# Patient Record
Sex: Female | Born: 1985 | Race: Asian | Hispanic: No | Marital: Married | Smoking: Never smoker
Health system: Southern US, Community
[De-identification: ages and names within clinical notes are randomized; demographics above are authoritative.]

## PROBLEM LIST (undated history)

## (undated) DIAGNOSIS — L409 Psoriasis, unspecified: Secondary | ICD-10-CM

## (undated) DIAGNOSIS — Z970 Presence of artificial eye: Secondary | ICD-10-CM

## (undated) DIAGNOSIS — D649 Anemia, unspecified: Secondary | ICD-10-CM

## (undated) HISTORY — DX: Presence of artificial eye: Z97.0

## (undated) HISTORY — PX: TUBAL LIGATION: SHX77

---

## 2014-02-03 LAB — OB RESULTS CONSOLE GC/CHLAMYDIA
Chlamydia: NEGATIVE
Gonorrhea: NEGATIVE

## 2014-03-25 ENCOUNTER — Other Ambulatory Visit: Payer: Self-pay | Admitting: Obstetrics & Gynecology

## 2014-04-07 ENCOUNTER — Encounter (HOSPITAL_COMMUNITY): Payer: Self-pay

## 2014-04-08 ENCOUNTER — Encounter (HOSPITAL_COMMUNITY): Payer: Self-pay

## 2014-04-08 ENCOUNTER — Encounter (HOSPITAL_COMMUNITY): Payer: Self-pay | Admitting: Pharmacist

## 2014-04-08 ENCOUNTER — Encounter (HOSPITAL_COMMUNITY)
Admission: RE | Admit: 2014-04-08 | Discharge: 2014-04-08 | Disposition: A | Discharge status: Home / Self Care | Payer: 59 | Source: Ambulatory Visit | Attending: Obstetrics & Gynecology | Admitting: Obstetrics & Gynecology

## 2014-04-08 HISTORY — DX: Anemia, unspecified: D64.9

## 2014-04-08 HISTORY — DX: Psoriasis, unspecified: L40.9

## 2014-04-08 LAB — RPR

## 2014-04-08 LAB — TYPE AND SCREEN
ABO/RH(D): A NEG
Antibody Screen: NEGATIVE

## 2014-04-08 LAB — CBC
HCT: 37 % (ref 36.0–46.0)
Hemoglobin: 12.3 g/dL (ref 12.0–15.0)
MCH: 26.7 pg (ref 26.0–34.0)
MCHC: 33.2 g/dL (ref 30.0–36.0)
MCV: 80.3 fL (ref 78.0–100.0)
PLATELETS: 235 10*3/uL (ref 150–400)
RBC: 4.61 MIL/uL (ref 3.87–5.11)
RDW: 16.4 % — ABNORMAL HIGH (ref 11.5–15.5)
WBC: 10.8 10*3/uL — ABNORMAL HIGH (ref 4.0–10.5)

## 2014-04-08 LAB — ABO/RH: ABO/RH(D): A NEG

## 2014-04-08 NOTE — Patient Instructions (Addendum)
   Your procedure is scheduled on:  Friday, June 12  Enter through the [employer] [facility] at: 11:45 AM Pick up the phone at the desk and dial [phone] and inform us of your arrival.  Please call this number if you have any problems the morning of surgery: [phone]  Remember: Do not eat food after midnight: Thursday Do not drink clear liquids after: 9 AM Friday, day of surgery Take these medicines the morning of surgery with a SIP OF WATER:  None  Do not wear jewelry, make-up, or FINGER nail polish No metal in your hair or on your body. Do not wear lotions, powders, perfumes.  You may wear deodorant.  Do not bring valuables to the hospital. Contacts, dentures or bridgework may not be worn into surgery.  Leave suitcase in the car. After Surgery it may be brought to your room. For patients being admitted to the hospital, checkout time is 11:00am the day of discharge.  Home with husband  [employer] cell [phone].

## 2014-04-09 ENCOUNTER — Inpatient Hospital Stay (HOSPITAL_COMMUNITY)
Admission: AD | Admit: 2014-04-09 | Discharge: 2014-04-12 | DRG: 766 | Disposition: A | Discharge status: Home / Self Care | Payer: 59 | Source: Ambulatory Visit | Attending: Obstetrics & Gynecology | Admitting: Obstetrics & Gynecology

## 2014-04-09 ENCOUNTER — Encounter (HOSPITAL_COMMUNITY): Payer: Self-pay

## 2014-04-09 ENCOUNTER — Encounter (HOSPITAL_COMMUNITY): Payer: 59 | Admitting: Anesthesiology

## 2014-04-09 ENCOUNTER — Encounter (HOSPITAL_COMMUNITY)
Admission: AD | Disposition: A | Discharge status: Home / Self Care | Payer: Self-pay | Source: Ambulatory Visit | Attending: Obstetrics & Gynecology

## 2014-04-09 ENCOUNTER — Inpatient Hospital Stay (HOSPITAL_COMMUNITY): Payer: 59 | Admitting: Anesthesiology

## 2014-04-09 DIAGNOSIS — L298 Other pruritus: Secondary | ICD-10-CM | POA: Diagnosis not present

## 2014-04-09 DIAGNOSIS — O9902 Anemia complicating childbirth: Secondary | ICD-10-CM | POA: Diagnosis present

## 2014-04-09 DIAGNOSIS — O360990 Maternal care for other rhesus isoimmunization, unspecified trimester, not applicable or unspecified: Secondary | ICD-10-CM | POA: Diagnosis present

## 2014-04-09 DIAGNOSIS — O322XX0 Maternal care for transverse and oblique lie, not applicable or unspecified: Secondary | ICD-10-CM | POA: Diagnosis present

## 2014-04-09 DIAGNOSIS — L2989 Other pruritus: Secondary | ICD-10-CM | POA: Diagnosis not present

## 2014-04-09 DIAGNOSIS — D649 Anemia, unspecified: Secondary | ICD-10-CM | POA: Diagnosis present

## 2014-04-09 DIAGNOSIS — O26899 Other specified pregnancy related conditions, unspecified trimester: Secondary | ICD-10-CM | POA: Diagnosis not present

## 2014-04-09 DIAGNOSIS — O34219 Maternal care for unspecified type scar from previous cesarean delivery: Principal | ICD-10-CM | POA: Diagnosis present

## 2014-04-09 SURGERY — Surgical Case
Anesthesia: Spinal | Site: Abdomen

## 2014-04-09 MED ORDER — ONDANSETRON HCL 4 MG/2ML IJ SOLN: 4.0000 mg | Freq: Three times a day (TID) | INTRAMUSCULAR | Status: DC | PRN

## 2014-04-09 MED ORDER — TETANUS-DIPHTH-ACELL PERTUSSIS 5-2.5-18.5 LF-MCG/0.5 IM SUSP: 0.5000 mL | Freq: Once | INTRAMUSCULAR | Status: DC

## 2014-04-09 MED ORDER — PHENYLEPHRINE 8 MG IN D5W 100 ML (0.08MG/ML) PREMIX OPTIME
INJECTION | INTRAVENOUS | Status: DC | PRN
  Administered 2014-04-09: 60 ug/min via INTRAVENOUS

## 2014-04-09 MED ORDER — BUPIVACAINE IN DEXTROSE 0.75-8.25 % IT SOLN
INTRATHECAL | Status: DC | PRN
  Administered 2014-04-09: 1.5 mL via INTRATHECAL

## 2014-04-09 MED ORDER — NALOXONE HCL 1 MG/ML IJ SOLN
1.0000 ug/kg/h | INTRAVENOUS | Status: DC | PRN
  Filled 2014-04-09: qty 2

## 2014-04-09 MED ORDER — SODIUM CHLORIDE 0.9 % IJ SOLN: 3.0000 mL | INTRAMUSCULAR | Status: DC | PRN

## 2014-04-09 MED ORDER — OXYTOCIN 40 UNITS IN LACTATED RINGERS INFUSION - SIMPLE MED: 62.5000 mL/h | INTRAVENOUS | Status: AC

## 2014-04-09 MED ORDER — KETOROLAC TROMETHAMINE 30 MG/ML IJ SOLN: 30.0000 mg | Freq: Four times a day (QID) | INTRAMUSCULAR | Status: DC | PRN

## 2014-04-09 MED ORDER — IBUPROFEN 600 MG PO TABS
600.0000 mg | ORAL_TABLET | Freq: Four times a day (QID) | ORAL | Status: DC
  Administered 2014-04-10 – 2014-04-12 (×11): 600 mg via ORAL
  Filled 2014-04-09 (×11): qty 1

## 2014-04-09 MED ORDER — SCOPOLAMINE 1 MG/3DAYS TD PT72
1.0000 | MEDICATED_PATCH | Freq: Once | TRANSDERMAL | Status: DC
  Administered 2014-04-09: 1.5 mg via TRANSDERMAL

## 2014-04-09 MED ORDER — GLYCOPYRROLATE 0.2 MG/ML IJ SOLN
INTRAMUSCULAR | Status: DC | PRN
  Administered 2014-04-09: 0.2 mg via INTRAVENOUS

## 2014-04-09 MED ORDER — KETOROLAC TROMETHAMINE 30 MG/ML IJ SOLN
30.0000 mg | Freq: Four times a day (QID) | INTRAMUSCULAR | Status: DC | PRN
  Administered 2014-04-09: 30 mg via INTRAMUSCULAR

## 2014-04-09 MED ORDER — SIMETHICONE 80 MG PO CHEW
80.0000 mg | CHEWABLE_TABLET | Freq: Three times a day (TID) | ORAL | Status: DC
  Administered 2014-04-09 – 2014-04-11 (×6): 80 mg via ORAL
  Filled 2014-04-09 (×7): qty 1

## 2014-04-09 MED ORDER — WITCH HAZEL-GLYCERIN EX PADS: 1.0000 "application " | MEDICATED_PAD | CUTANEOUS | Status: DC | PRN

## 2014-04-09 MED ORDER — NALOXONE HCL 0.4 MG/ML IJ SOLN: 0.4000 mg | INTRAMUSCULAR | Status: DC | PRN

## 2014-04-09 MED ORDER — LACTATED RINGERS IV SOLN
INTRAVENOUS | Status: DC
  Administered 2014-04-09 (×4): via INTRAVENOUS

## 2014-04-09 MED ORDER — ONDANSETRON HCL 4 MG PO TABS: 4.0000 mg | ORAL_TABLET | ORAL | Status: DC | PRN

## 2014-04-09 MED ORDER — MORPHINE SULFATE 0.5 MG/ML IJ SOLN
INTRAMUSCULAR | Status: AC
  Filled 2014-04-09: qty 10

## 2014-04-09 MED ORDER — MEPERIDINE HCL 25 MG/ML IJ SOLN: 6.2500 mg | INTRAMUSCULAR | Status: DC | PRN

## 2014-04-09 MED ORDER — NALBUPHINE HCL 10 MG/ML IJ SOLN: 5.0000 mg | INTRAMUSCULAR | Status: DC | PRN

## 2014-04-09 MED ORDER — NALBUPHINE HCL 10 MG/ML IJ SOLN
INTRAMUSCULAR | Status: AC
  Administered 2014-04-09: 10 mg via SUBCUTANEOUS
  Filled 2014-04-09: qty 1

## 2014-04-09 MED ORDER — FENTANYL CITRATE 0.05 MG/ML IJ SOLN
INTRAMUSCULAR | Status: AC
  Filled 2014-04-09: qty 2

## 2014-04-09 MED ORDER — SENNOSIDES-DOCUSATE SODIUM 8.6-50 MG PO TABS
2.0000 | ORAL_TABLET | ORAL | Status: DC
  Administered 2014-04-10 – 2014-04-12 (×3): 2 via ORAL
  Filled 2014-04-09: qty 1
  Filled 2014-04-09 (×3): qty 2

## 2014-04-09 MED ORDER — DIPHENHYDRAMINE HCL 50 MG/ML IJ SOLN
25.0000 mg | INTRAMUSCULAR | Status: DC | PRN
Start: 2014-04-09 — End: 2014-04-12

## 2014-04-09 MED ORDER — LANOLIN HYDROUS EX OINT: 1.0000 "application " | TOPICAL_OINTMENT | CUTANEOUS | Status: DC | PRN

## 2014-04-09 MED ORDER — OXYTOCIN 10 UNIT/ML IJ SOLN
40.0000 [IU] | INTRAVENOUS | Status: DC | PRN
  Administered 2014-04-09: 40 [IU] via INTRAVENOUS

## 2014-04-09 MED ORDER — FERROUS SULFATE 300 (60 FE) MG/5ML PO SYRP
300.0000 mg | ORAL_SOLUTION | Freq: Every day | ORAL | Status: DC
  Administered 2014-04-10 – 2014-04-11 (×2): 300 mg via ORAL
  Filled 2014-04-09 (×3): qty 5

## 2014-04-09 MED ORDER — PRENATAL MULTIVITAMIN CH
1.0000 | ORAL_TABLET | Freq: Every day | ORAL | Status: DC
  Administered 2014-04-10 – 2014-04-12 (×3): 1 via ORAL
  Filled 2014-04-09 (×3): qty 1

## 2014-04-09 MED ORDER — LACTATED RINGERS IV SOLN
INTRAVENOUS | Status: DC
  Administered 2014-04-09: 23:00:00 via INTRAVENOUS

## 2014-04-09 MED ORDER — ONDANSETRON HCL 4 MG/2ML IJ SOLN
INTRAMUSCULAR | Status: AC
  Filled 2014-04-09: qty 2

## 2014-04-09 MED ORDER — OXYTOCIN 10 UNIT/ML IJ SOLN
INTRAMUSCULAR | Status: AC
  Filled 2014-04-09: qty 4

## 2014-04-09 MED ORDER — DIPHENHYDRAMINE HCL 25 MG PO CAPS: 25.0000 mg | ORAL_CAPSULE | ORAL | Status: DC | PRN

## 2014-04-09 MED ORDER — ZOLPIDEM TARTRATE 5 MG PO TABS: 5.0000 mg | ORAL_TABLET | Freq: Every evening | ORAL | Status: DC | PRN

## 2014-04-09 MED ORDER — MENTHOL 3 MG MT LOZG: 1.0000 | LOZENGE | OROMUCOSAL | Status: DC | PRN

## 2014-04-09 MED ORDER — MORPHINE SULFATE (PF) 0.5 MG/ML IJ SOLN
INTRAMUSCULAR | Status: DC | PRN
  Administered 2014-04-09: .15 mg via INTRATHECAL

## 2014-04-09 MED ORDER — METOCLOPRAMIDE HCL 5 MG/ML IJ SOLN: 10.0000 mg | Freq: Three times a day (TID) | INTRAMUSCULAR | Status: DC | PRN

## 2014-04-09 MED ORDER — CEFAZOLIN SODIUM-DEXTROSE 2-3 GM-% IV SOLR
INTRAVENOUS | Status: AC
  Filled 2014-04-09: qty 50

## 2014-04-09 MED ORDER — SIMETHICONE 80 MG PO CHEW: 80.0000 mg | CHEWABLE_TABLET | ORAL | Status: DC | PRN

## 2014-04-09 MED ORDER — ONDANSETRON HCL 4 MG/2ML IJ SOLN
INTRAMUSCULAR | Status: DC | PRN
  Administered 2014-04-09: 4 mg via INTRAVENOUS

## 2014-04-09 MED ORDER — SCOPOLAMINE 1 MG/3DAYS TD PT72
MEDICATED_PATCH | TRANSDERMAL | Status: AC
  Administered 2014-04-09: 1.5 mg via TRANSDERMAL
  Filled 2014-04-09: qty 1

## 2014-04-09 MED ORDER — DIBUCAINE 1 % RE OINT: 1.0000 "application " | TOPICAL_OINTMENT | RECTAL | Status: DC | PRN

## 2014-04-09 MED ORDER — KETOROLAC TROMETHAMINE 30 MG/ML IJ SOLN
INTRAMUSCULAR | Status: AC
  Filled 2014-04-09: qty 1

## 2014-04-09 MED ORDER — SIMETHICONE 80 MG PO CHEW
80.0000 mg | CHEWABLE_TABLET | ORAL | Status: DC
  Administered 2014-04-10 – 2014-04-12 (×3): 80 mg via ORAL
  Filled 2014-04-09 (×3): qty 1

## 2014-04-09 MED ORDER — NALBUPHINE HCL 10 MG/ML IJ SOLN
5.0000 mg | INTRAMUSCULAR | Status: DC | PRN
  Administered 2014-04-09: 10 mg via SUBCUTANEOUS

## 2014-04-09 MED ORDER — DIPHENHYDRAMINE HCL 50 MG/ML IJ SOLN: 12.5000 mg | INTRAMUSCULAR | Status: DC | PRN

## 2014-04-09 MED ORDER — ONDANSETRON HCL 4 MG/2ML IJ SOLN: 4.0000 mg | INTRAMUSCULAR | Status: DC | PRN

## 2014-04-09 MED ORDER — FENTANYL CITRATE 0.05 MG/ML IJ SOLN
INTRAMUSCULAR | Status: DC | PRN
  Administered 2014-04-09: 25 ug via INTRATHECAL

## 2014-04-09 MED ORDER — DIPHENHYDRAMINE HCL 25 MG PO CAPS: 25.0000 mg | ORAL_CAPSULE | Freq: Four times a day (QID) | ORAL | Status: DC | PRN

## 2014-04-09 MED ORDER — OXYCODONE-ACETAMINOPHEN 5-325 MG PO TABS
1.0000 | ORAL_TABLET | ORAL | Status: DC | PRN
  Administered 2014-04-11 (×2): 1 via ORAL
  Filled 2014-04-09 (×2): qty 1

## 2014-04-09 MED ORDER — FENTANYL CITRATE 0.05 MG/ML IJ SOLN: 25.0000 ug | INTRAMUSCULAR | Status: DC | PRN

## 2014-04-09 MED ORDER — PHENYLEPHRINE HCL 10 MG/ML IJ SOLN
INTRAMUSCULAR | Status: AC
  Filled 2014-04-09: qty 1

## 2014-04-09 MED ORDER — CEFAZOLIN SODIUM-DEXTROSE 2-3 GM-% IV SOLR
2.0000 g | INTRAVENOUS | Status: AC
  Administered 2014-04-09: 2 g via INTRAVENOUS

## 2014-04-09 SURGICAL SUPPLY — 39 items
BENZOIN TINCTURE PRP APPL 2/3 (GAUZE/BANDAGES/DRESSINGS) ×3 IMPLANT
CLAMP CORD UMBIL (MISCELLANEOUS) IMPLANT
CLOSURE WOUND 1/2 X4 (GAUZE/BANDAGES/DRESSINGS) ×1
CLOTH BEACON ORANGE TIMEOUT ST (SAFETY) ×3 IMPLANT
CONTAINER PREFILL 10% NBF 15ML (MISCELLANEOUS) IMPLANT
DRAPE LG THREE QUARTER DISP (DRAPES) IMPLANT
DRSG OPSITE POSTOP 4X10 (GAUZE/BANDAGES/DRESSINGS) ×3 IMPLANT
DURAPREP 26ML APPLICATOR (WOUND CARE) ×3 IMPLANT
ELECT REM PT RETURN 9FT ADLT (ELECTROSURGICAL) ×3
ELECTRODE REM PT RTRN 9FT ADLT (ELECTROSURGICAL) ×1 IMPLANT
EXTRACTOR VACUUM BELL STYLE (SUCTIONS) ×3 IMPLANT
EXTRACTOR VACUUM KIWI (MISCELLANEOUS) ×3 IMPLANT
EXTRACTOR VACUUM M CUP 4 TUBE (SUCTIONS) ×2 IMPLANT
EXTRACTOR VACUUM M CUP 4' TUBE (SUCTIONS) ×1
GLOVE BIO SURGEON STRL SZ7 (GLOVE) ×3 IMPLANT
GLOVE BIOGEL PI IND STRL 7.0 (GLOVE) ×1 IMPLANT
GLOVE BIOGEL PI INDICATOR 7.0 (GLOVE) ×2
GOWN STRL REUS W/TWL LRG LVL3 (GOWN DISPOSABLE) ×6 IMPLANT
KIT ABG SYR 3ML LUER SLIP (SYRINGE) IMPLANT
NEEDLE HYPO 25X5/8 SAFETYGLIDE (NEEDLE) IMPLANT
NS IRRIG 1000ML POUR BTL (IV SOLUTION) ×3 IMPLANT
PACK C SECTION WH (CUSTOM PROCEDURE TRAY) ×3 IMPLANT
PAD OB MATERNITY 4.3X12.25 (PERSONAL CARE ITEMS) ×3 IMPLANT
RTRCTR C-SECT PINK 25CM LRG (MISCELLANEOUS) IMPLANT
STAPLER VISISTAT 35W (STAPLE) IMPLANT
STRIP CLOSURE SKIN 1/2X4 (GAUZE/BANDAGES/DRESSINGS) ×2 IMPLANT
SUT MON AB-0 CT1 36 (SUTURE) ×9 IMPLANT
SUT PLAIN 0 NONE (SUTURE) IMPLANT
SUT PLAIN 2 0 (SUTURE)
SUT PLAIN ABS 2-0 CT1 27XMFL (SUTURE) IMPLANT
SUT VIC AB 0 CT1 27 (SUTURE) ×4
SUT VIC AB 0 CT1 27XBRD ANBCTR (SUTURE) ×2 IMPLANT
SUT VIC AB 2-0 CT1 27 (SUTURE) ×4
SUT VIC AB 2-0 CT1 TAPERPNT 27 (SUTURE) ×2 IMPLANT
SUT VIC AB 4-0 KS 27 (SUTURE) ×3 IMPLANT
SUT VICRYL 0 TIES 12 18 (SUTURE) IMPLANT
TOWEL OR 17X24 6PK STRL BLUE (TOWEL DISPOSABLE) ×3 IMPLANT
TRAY FOLEY CATH 14FR (SET/KITS/TRAYS/PACK) IMPLANT
WATER STERILE IRR 1000ML POUR (IV SOLUTION) IMPLANT

## 2014-04-09 NOTE — Anesthesia Postprocedure Evaluation (Signed)
  Anesthesia Post-op Note  Patient: Carly Gordon  Procedure(s) Performed: Procedure(s) with comments: Repeat CESAREAN SECTION (N/A) - EDD: 04/06/14  Patient Location: PACU  Anesthesia Type:Spinal  Level of Consciousness: awake, alert  and oriented  Airway and Oxygen Therapy: Patient Spontanous Breathing  Post-op Pain: none  Post-op Assessment: Post-op Vital signs reviewed, Patient's Cardiovascular Status Stable, Respiratory Function Stable, Patent Airway, No signs of Nausea or vomiting, Pain level controlled, No headache and No backache  Post-op Vital Signs: Reviewed and stable  Last Vitals:  Filed Vitals:   04/09/14 1515  BP: 108/91  Pulse: 65  Temp:   Resp: 20    Complications: No apparent anesthesia complications

## 2014-04-09 NOTE — Anesthesia Procedure Notes (Signed)
Spinal  Patient location during procedure: OR Start time: 04/09/2014 1:26 PM Staffing Anesthesiologist: Anayelli Lai A. Performed by: anesthesiologist  Preanesthetic Checklist Completed: patient identified, site marked, surgical consent, pre-op evaluation, timeout performed, IV checked, risks and benefits discussed and monitors and equipment checked Spinal Block Patient position: sitting Prep: site prepped and draped and DuraPrep Patient monitoring: heart rate, cardiac monitor, continuous pulse ox and blood pressure Approach: midline Location: L3-4 Injection technique: single-shot Needle Needle type: Sprotte  Needle gauge: 24 G Needle length: 9 cm Needle insertion depth: 5 cm Assessment Sensory level: T4 Additional Notes Patient tolerated procedure well. Adequate sensory level.

## 2014-04-09 NOTE — Anesthesia Preprocedure Evaluation (Addendum)

## 2014-04-09 NOTE — Transfer of Care (Signed)
Immediate Anesthesia Transfer of Care Note  Patient: Carly Gordon  Procedure(s) Performed: Procedure(s) with comments: Repeat CESAREAN SECTION (N/A) - EDD: 04/06/14  Patient Location: PACU  Anesthesia Type:Spinal  Level of Consciousness: awake, alert  and oriented  Airway & Oxygen Therapy: Patient Spontanous Breathing  Post-op Assessment: Report given to PACU RN and Post -op Vital signs reviewed and stable  Post vital signs: Reviewed and stable  Complications: No apparent anesthesia complications

## 2014-04-09 NOTE — Op Note (Signed)
Cesarean Section Procedure Note   Carly Gordon  04/09/2014  Indications: Scheduled Proceedure/Maternal Request   Procedure Note: Repeat Low Transverse Cesarean Section   Pre-operative Diagnosis: Previous Cesarean Section. 40.3 wks  Post-operative Diagnosis: Same   Surgeon: Robley FriesVaishali R Rolinda Impson, MD   Assistants: Raelyn Moraolitta Dawson, CNM  Anesthesia: spinal   Procedure Details:  The patient was seen in the Holding Room. The risks, benefits, complications, treatment options, and expected outcomes were discussed with the patient. The patient concurred with the proposed plan, giving informed consent. identified as Carly Gordon and the procedure verified as C-Section Delivery. A Time Out was held and the above information confirmed. 2 gm Ancef given.  After induction of Spinal anesthesia, the patient was draped and prepped in the usual sterile manner. Foley catheter was placed. A Pfannenstiel incision was made and carried down through the subcutaneous tissue to the fascia. Fascial incision was made and extended transversely. The fascia was separated from the underlying rectus tissue superiorly and inferiorly. The peritoneum was identified and entered. Peritoneal incision was extended longitudinally. The utero-vesical peritoneal reflection was incised transversely and the bladder flap was bluntly freed from the lower uterine segment. A low transverse uterine incision was made. Baby was in oblique lie with unengaged head. Amniotic fluid was clear and copious. Baby's head delivery was completed after 3 different vacuum devices since all were popping off due to fetal hair. Maternal right rectus muscle was cut to increase room for head delivery and with last vacuum, baby's head delivery was completed. She was born at Apgar scores of 8 at one minute and 9 at five minutes. Cord ph was not sent the umbilical cord was clamped and cut cord blood was obtained for evaluation. The placenta was  removed Intact and appeared normal. The uterine outline, tubes and ovaries appeared normal. The uterine incision was closed with running locked sutures of 0Vicryl. A second layer closure was done with excellent hemostasis. Peritoneal closure done with 2-0 Vicryl. Muscles were approximated in midline. The fascia was then reapproximated with running sutures of 0Monocryl.. The subcuticular closure was performed using 2-0plain gut. The skin was closed with 4-0Vicryl. Steristrips and sterile dressing placed.   Instrument, sponge, and needle counts were correct prior the abdominal closure and were correct at the conclusion of the case.   Findings: Female infant delivered at 13.56 hrs on 04/09/14 [facility] with vacuum assistance due to oblique unflexed unengaged head. Clear amniotic fluid. Apgars 8 and 9 at 1 and 5 minutes. Normal ovaries, tubes, placenta and 3vessel cord.    Estimated Blood Loss: 600 ml  Total IV Fluids: 2500 ml   Urine Output: 300CC OF clear urine  Specimens: Cord blood  Complications: no complications  Disposition: PACU - hemodynamically stable.   Maternal Condition: stable   Baby condition / location:  Couplet care / Skin to Skin  Attending Attestation: I performed the procedure.   Signed: Surgeon(s): Robley FriesVaishali R Oran Dillenburg, MD

## 2014-04-09 NOTE — H&P (Signed)
Carly Gordon is a 28 y.o. female presenting at 40.3 wks for repeat C/section, declined permanent sterilization. No contractions/ leaking/ bleeding, Good FMs.  [facility] - late [place] from out of town at 31 wks but [facility] [place].  Outside labs - normal. Rh negative. S/p Rhogam.   History OB History   Grav Para Term Preterm Abortions TAB SAB Ect Mult Living   3 1 1  1  1   1      Past Medical History  Diagnosis Date  . Anemia   . Psoriasis     legs   Past Surgical History  Procedure Laterality Date  . Cesarean section  02/2012    x 1 [place]   Family History: family history is not on file. Social History:  reports that she has never smoked. She has never used smokeless tobacco. She reports that she does not drink alcohol or use illicit drugs.   Prenatal Transfer Tool  Maternal Diabetes: No Genetic Screening: Normal Maternal Ultrasounds/Referrals: Normal Fetal Ultrasounds or other Referrals:  None Maternal Substance Abuse:  No Significant Maternal Medications:  None Significant Maternal Lab Results:  Lab values include: Group B Strep negative Other Comments:  None  ROS neg   Last menstrual period 06/30/2013. Exam Physical Exam  A&O x 3, no acute distress. Pleasant HEENT neg, no thyromegaly Lungs CTA bilat CV RRR, S1S2 normal Abdo soft, non tender, non acute Extr no edema/ tenderness Pelvic def FHT 140s Toco none   Prenatal labs: ABO, Rh: --/--/A NEG, A NEG (06/11 0925) Antibody: NEG (06/11 0925) Rubella:  immune RPR: NON REAC (06/11 0925)  HBsAg:   neg HIV:   neg GBS:   neg   Assessment/Plan: 28 yo, G3P1011 at 40.3 wks, prior C/s, desires repeat C/section since didn't have spontaneous labor and does not want to wait for VBAC.  Risks/complications of surgery reviewed incl infection, bleeding, damage to internal organs including bladder, bowels, ureters, blood vessels, other risks from  anesthesia, VTE and delayed complications of any surgery, complications in future surgery reviewed. Also discussed neonatal complications incl difficult delivery, laceration, vacuum assistance, TTN etc. Pt understands and agrees, all concerns addressed.      Albina Gosney R 04/09/2014, 10:23 AM

## 2014-04-10 LAB — CBC
HCT: 36.3 % (ref 36.0–46.0)
HEMOGLOBIN: 12.1 g/dL (ref 12.0–15.0)
MCH: 26.5 pg (ref 26.0–34.0)
MCHC: 33.3 g/dL (ref 30.0–36.0)
MCV: 79.4 fL (ref 78.0–100.0)
Platelets: 251 10*3/uL (ref 150–400)
RBC: 4.57 MIL/uL (ref 3.87–5.11)
RDW: 16.3 % — AB (ref 11.5–15.5)
WBC: 11.8 10*3/uL — ABNORMAL HIGH (ref 4.0–10.5)

## 2014-04-10 MED ORDER — RHO D IMMUNE GLOBULIN 1500 UNIT/2ML IJ SOSY
300.0000 ug | PREFILLED_SYRINGE | Freq: Once | INTRAMUSCULAR | Status: AC
  Administered 2014-04-10: 300 ug via INTRAMUSCULAR
  Filled 2014-04-10: qty 2

## 2014-04-10 NOTE — Addendum Note (Signed)
Addendum created 04/10/14 1204 by Algis GreenhouseLinda A Parag Dorton, CRNA   Modules edited: Charges VN, Notes Section   Notes Section:  File: [id number]

## 2014-04-10 NOTE — Progress Notes (Signed)
POD # 1  Subjective: Pt reports feeling well, some overall itching/ Pain controlled with Motrin and Percocet Tolerating po/ Foley d/c'd and  voiding without problems/ No n/v/ Flatus-belching Activity: ad lib Bleeding is light Newborn info:  Information for the patient's newborn:  Mickie BailRamakrishnan, Girl Mellany [[id number]][[id number]]  female Feeding: breast and bottle   Objective:  VS:  Filed Vitals:   04/10/14 0005 04/10/14 0201 04/10/14 0550 04/10/14 1008  BP: 109/59 101/71 95/52 118/86  Pulse: 90 87 71 79  Temp: 98.6 F (37 C) 98.8 F (37.1 C) 98.2 F (36.8 C) 98.8 F (37.1 C)  TempSrc: Oral Oral Oral Oral  Resp: 18 17 18 20   Weight:      SpO2: 94% 93% 98% 97%     I&O: Intake/Output     06/12 0701 - 06/13 0700 06/13 0701 - 06/14 0700   P.O. 1100    I.V. (mL/kg) 3591 (46.6)    Total Intake(mL/kg) 4691 (60.8)    Urine (mL/kg/hr) 3650    Blood 600    Total Output 4250     Net +441          Urine Occurrence  900 x      Recent Labs  04/08/14 0925 04/10/14 0623  WBC 10.8* 11.8*  HGB 12.3 12.1  HCT 37.0 36.3  PLT 235 251    Blood type: --/--/A NEG (06/13 0623) Rubella:      Physical Exam:  General: alert and cooperative CV: Regular rate and rhythm Resp: CTA bilaterally Abdomen: soft, nontender, normal bowel sounds Incision: healing well, no erythema, no hernia, no seroma, no swelling, well approximated, drk red drainage to left lateral honeycomb, outlined Uterine Fundus: firm, below umbilicus, nontender Lochia: minimal Ext: extremities normal, atraumatic, no cyanosis or edema and Homans sign is negative, no sign of DVT    Assessment: POD # 1/ G3P2012/ S/P C/Section d/t repeat Post-op pruritus Doing well  Plan: Benadryl po prn itching  Ambulate Continue routine post op orders   Signed: Donette LarryBHAMBRI, Elster Corbello, N, MSN, CNM 04/10/2014, 11:02 AM

## 2014-04-10 NOTE — Lactation Note (Signed)
This note was copied from the chart of Carly Gordon. Lactation Consultation Note Initial visit at 33 hours of age. [facility] given and discussed.  Encouraged to feed with early cues on demand.  Early newborn behavior discussed.  Hand expression demonstrated with colostrum visible on left breast only.  Assisted with feeding attempt.  Mom holding baby in cradle and baby latches to tip of nipple only.  After much discussion mom understands she needs to work on a deeper latch.  Language barrier with FOB at bedside interpreting although mom speaks and seems to understand english well.  Mom is very anxious about breast feeding positions and assistance needed to obtain a deep latch.  Attempted to encouraged and relax mom. Baby is able to latch deeply with wide flanged lips after lower lips is rolled out.  Several initial gulps heard and then baby was on and off at the breast and milk transfer was not obvious.  Baby seems to be looking for quick let down as with many previous bottle feedings.  FOB is concerned about milk supply and plans to continue bottle feeding formula some.  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  Baby extend tongue well past lower gum line, but continue observation of milk transfer is warranted. FOB and mom appreciative of help, but difficult to receive help from lactation at this visit. I{m not sure mom will initiate breast feeding at next feeding. Mom to call for assist as needed. [facility] including supporting plan to continue supplementing breast feeding with formula until further assessment.  Patient Name: Carly Gordon Reason for consult: Initial assessment   Maternal Data Has patient been taught Hand Expression?: Yes Does the patient have breastfeeding experience prior to this delivery?: Yes  Feeding Feeding Type: Breast Fed Length  of feed: 10 min  LATCH Score/Interventions Latch: Grasps breast easily, tongue down, lips flanged, rhythmical sucking.  Audible Swallowing: A few with stimulation Intervention(s): Skin to skin;Hand expression  Type of Nipple: Everted at rest and after stimulation  Comfort (Breast/Nipple): Soft / non-tender     Hold (Positioning): Assistance needed to correctly position infant at breast and maintain latch. Intervention(s): Breastfeeding basics reviewed;Support Pillows;Position options;Skin to skin  LATCH Score: 8  Lactation Tools Discussed/Used     Consult Status Consult Status: Follow-up Date: 04/11/14 Follow-up type: In-patient    Jannifer RodneyShoptaw, Jana Lynn Gordon, 11:51 PM

## 2014-04-10 NOTE — Anesthesia Postprocedure Evaluation (Signed)
Anesthesia Post Note  Patient: Carly Gordon  Procedure(s) Performed: Procedure(s) (LRB): Repeat CESAREAN SECTION (N/A)  Anesthesia type: Spinal  Patient location: Mother/Baby  Post pain: Pain level controlled  Post assessment: Post-op Vital signs reviewed  Last Vitals:  Filed Vitals:   04/10/14 1008  BP: 118/86  Pulse: 79  Temp: 37.1 C  Resp: 20    Post vital signs: Reviewed  Level of consciousness: awake  Complications: No apparent anesthesia complications

## 2014-04-10 NOTE — Lactation Note (Signed)
This note was copied from the chart of Carly Dameka Koble. Attempted visit, mom asleep awaken to request visit later.  Mom to call as needed.  Patient Name: Carly Gordon Today's Date: 04/10/2014     Maternal Data    Feeding Feeding Type: Bottle Fed - Formula Length of feed: 1 min  LATCH Score/Interventions Latch: Repeated attempts needed to sustain latch, nipple held in mouth throughout feeding, stimulation needed to elicit sucking reflex.  Audible Swallowing: None Intervention(s): Skin to skin  Type of Nipple: Everted at rest and after stimulation  Comfort (Breast/Nipple): Soft / non-tender     Hold (Positioning): Assistance needed to correctly position infant at breast and maintain latch.  LATCH Score: 6  Lactation Tools Discussed/Used     Consult Status      Shoptaw, Arvella MerlesJana Lynn 04/10/2014, 5:57 PM

## 2014-04-11 LAB — RH IG WORKUP (INCLUDES ABO/RH)
ABO/RH(D): A NEG
Fetal Screen: NEGATIVE
Gestational Age(Wks): 40.3
Unit division: 0

## 2014-04-11 NOTE — Progress Notes (Signed)
POD # 2  Subjective: Pt reports feeling well/ Pain controlled with Motrin and Percocet Tolerating po/Voiding without problems/ No n/v/ Flatus present, +BM Activity: ad lib Bleeding is light Newborn info:  Information for the patient's newborn:  Mickie BailRamakrishnan, Girl Kierah [[id number]][[id number]]  female Feeding: bottle   Objective: VS:  Filed Vitals:   04/10/14 1008 04/10/14 1419 04/10/14 1800 04/11/14 0524  BP: 118/86 99/65 97/60  111/60  Pulse: 79 66 72 82  Temp: 98.8 F (37.1 C) 98.8 F (37.1 C) 97.7 F (36.5 C) 98.1 F (36.7 C)  TempSrc: Oral Oral Oral Oral  Resp: 20 20 18 18   Weight:      SpO2: 97% 98%  100%    I&O: Intake/Output     06/13 0701 - 06/14 0700 06/14 0701 - 06/15 0700   P.O.     I.V. (mL/kg)     Total Intake(mL/kg)     Urine (mL/kg/hr)     Blood     Total Output       Net            Urine Occurrence 1600 x      LABS:  Recent Labs  04/10/14 0623  WBC 11.8*  HGB 12.1  HCT 36.3  PLT 251    Blood type: --/--/A NEG (06/13 0623) Rubella:       Physical Exam:  General: alert and cooperative CV: Regular rate and rhythm Resp: CTA bilaterally Abdomen: soft, nontender, normal bowel sounds Uterine Fundus: firm, below umbilicus, nontender Incision: Covered with Tegaderm and honeycomb dressing; no edema, bruising, or erythema, small brown drainage to left lateral-outlined with no extension; well approximated with suture Lochia: minimal Ext: extremities normal, atraumatic, no cyanosis or edema and Homans sign is negative, no sign of DVT    Assessment/: POD # 2/ G3P2012/ S/P C/Section d/t repeat Doing well  Plan: Continue routine post op orders Anticipate discharge home tomorrow   Signed: Donette LarryBHAMBRI, Ples Trudel, Dorris CarnesN, MSN, CNM 04/11/2014, 11:53 AM

## 2014-04-12 ENCOUNTER — Encounter (HOSPITAL_COMMUNITY): Payer: Self-pay | Admitting: Obstetrics & Gynecology

## 2014-04-12 MED ORDER — IBUPROFEN 600 MG PO TABS: 600.0000 mg | ORAL_TABLET | Freq: Four times a day (QID) | ORAL | Status: DC

## 2014-04-12 MED ORDER — OXYCODONE-ACETAMINOPHEN 5-325 MG PO TABS: 1.0000 | ORAL_TABLET | ORAL | Status: DC | PRN

## 2014-04-12 NOTE — Discharge Summary (Signed)
POSTOPERATIVE DISCHARGE SUMMARY:  Patient ID: Carly Gordon MRN: [id number] DOB/AGE: [birth year] 28 y.o.  Admit date: 04/09/2014 Admission Diagnoses: Repeat Cesarean Delivery at term   Discharge date:  04/12/2014 Discharge Diagnoses: S/P LTC/S due to repeat on 04/09/2014        Prenatal history: [id number]   EDC : 04/06/2014, by Last Menstrual Period  [facility] [redacted] wks gestation. Primary provider : Dr. Juliene PinaMody Prenatal course complicated by Anemia in Pregnancy  Prenatal Labs: ABO, Rh: --/--/A NEG (06/13 0623) / Rhophylac 01/14/2014 and 04/10/2014 Antibody: NEG (06/11 0925) Rubella:  Immune RPR: NON REAC (06/11 0925)  HBsAg:  Non-Reactive HIV:   Non-Reactive GTT : Normal GBS:  Negative  Medical / Surgical History :  Past medical history:  Past Medical History  Diagnosis Date  . Anemia   . Psoriasis     legs  . Postpartum care following cesarean delivery (6/12) 04/09/2014    Past surgical history:  Past Surgical History  Procedure Laterality Date  . Cesarean section  02/2012    x 1 [place]     Allergies: Review of patient's allergies indicates no known allergies.    Physical Exam:   VSS: Blood pressure 113/72, pulse 61, temperature 97.9 F (36.6 C), temperature source Oral, resp. rate 18, weight 77.111 kg (170 lb), last menstrual period 06/30/2013, SpO2 99.00%, unknown if currently breastfeeding.  LABS:  Recent Labs  04/10/14 0623  WBC 11.8*  HGB 12.1  PLT 251    Newborn Data Live born female on 04/09/2014 Birth Weight: 7 lb 8.1 oz (3405 g) APGAR: 8, 9  See operative report for further details  Home with mother.  Discharge Instructions:  Wound Care: keep clean and dry / remove honeycomb POD 7 Postpartum Instructions: [facility] discharge booklet - instructions reviewed Medications:    Medication List         CONCEPT DHA PO  Take 1 tablet by mouth daily.     FERROUS SULFATE PO  Take 30  mg by mouth daily. Uses [employer] brand     ibuprofen 600 MG tablet  Commonly known as:  ADVIL,MOTRIN  Take 1 tablet (600 mg total) by mouth every 6 (six) hours.     oxyCODONE-acetaminophen 5-325 MG per tablet  Commonly known as:  PERCOCET/ROXICET  Take 1-2 tablets by mouth every 4 (four) hours as needed for severe pain (moderate - severe pain).           Follow-up Information   Follow up with [name],[name], MD. Schedule an appointment as soon as possible for a visit in 6 weeks. (For postpartum visit)    Specialty:  Obstetrics and Gynecology   Contact information:   [address] [place] [id number] [phone]         Signed: Kenard GowerAWSON, Ladasha Schnackenberg, M, MSN, CNM 04/12/2014, 8:38 AM

## 2014-04-12 NOTE — Discharge Instructions (Signed)
Postpartum Depression and Baby Blues °The postpartum period begins right after the birth of a baby. During this time, there is often a great amount of joy and excitement. It is also a time of considerable changes in the life of the parent(s). Regardless of how many times a mother gives birth, each child brings new challenges and dynamics to the family. It is not unusual to have feelings of excitement accompanied by confusing shifts in moods, emotions, and thoughts. All mothers are at risk of developing postpartum depression or the "baby blues." These mood changes can occur right after giving birth, or they may occur many months after giving birth. The baby blues or postpartum depression can be mild or severe. Additionally, postpartum depression can resolve rather quickly, or it can be a long-term condition. °CAUSES °Elevated hormones and their rapid decline are thought to be a main cause of postpartum depression and the baby blues. There are a number of hormones that radically change during and after pregnancy. Estrogen and progesterone usually decrease immediately after delivering your baby. The level of thyroid hormone and various cortisol steroids also rapidly drop. Other factors that play a major role in these changes include major life events and genetics.  °RISK FACTORS °If you have any of the following risks for the baby blues or postpartum depression, know what symptoms to watch out for during the postpartum period. Risk factors that may increase the likelihood of getting the baby blues or postpartum depression include: °· Having a personal or family history of depression. °· Having depression while being pregnant. °· Having premenstrual or oral contraceptive-associated mood issues. °· Having exceptional life stress. °· Having marital conflict. °· Lacking a social support network. °· Having a baby with special needs. °· Having health problems such as diabetes. °SYMPTOMS °Baby blues symptoms include: °· Brief  fluctuations in mood, such as going from extreme happiness to sadness. °· Decreased concentration. °· Difficulty sleeping. °· Crying spells, tearfulness. °· Irritability. °· Anxiety. °Postpartum depression symptoms typically begin within the first month after giving birth. These symptoms include: °· Difficulty sleeping or excessive sleepiness. °· Marked weight loss. °· Agitation. °· Feelings of worthlessness. °· Lack of interest in activity or food. °Postpartum psychosis is a very concerning condition and can be dangerous. Fortunately, it is rare. Displaying any of the following symptoms is cause for immediate medical attention. Postpartum psychosis symptoms include: °· Hallucinations and delusions. °· Bizarre or disorganized behavior. °· Confusion or disorientation. °DIAGNOSIS  °A diagnosis is made by an evaluation of your symptoms. There are no medical or lab tests that lead to a diagnosis, but there are various questionnaires that a caregiver may use to identify those with the baby blues, postpartum depression, or psychosis. Often times, a screening tool called the Edinburgh Postnatal Depression Scale is used to diagnose depression in the postpartum period.  °TREATMENT °The baby blues usually goes away on its own in 1 to 2 weeks. Social support is often all that is needed. You should be encouraged to get adequate sleep and rest. Occasionally, you may be given medicines to help you sleep.  °Postpartum depression requires treatment as it can last several months or longer if it is not treated. Treatment may include individual or group therapy, medicine, or both to address any social, physiological, and psychological factors that may play a role in the depression. Regular exercise, a healthy diet, rest, and social support may also be strongly recommended.  °Postpartum psychosis is more serious and needs treatment right away. Hospitalization is   often needed. °HOME CARE INSTRUCTIONS °· Get as much rest as you can. Nap  when the baby sleeps. °· Exercise regularly. Some women find yoga and walking to be beneficial. °· Eat a balanced and nourishing diet. °· Do little things that you enjoy. Have a cup of tea, take a bubble bath, read your favorite magazine, or listen to your favorite music. °· Avoid alcohol. °· Ask for help with household chores, cooking, grocery shopping, or running errands as needed. Do not try to do everything. °· Talk to people close to you about how you are feeling. Get support from your partner, family members, friends, or other new moms. °· Try to stay positive in how you think. Think about the things you are grateful for. °· Do not spend a lot of time alone. °· Only take medicine as directed by your caregiver. °· Keep all your postpartum appointments. °· Let your caregiver know if you have any concerns. °SEEK MEDICAL CARE IF: °You are having a reaction or problems with your medicine. °SEEK IMMEDIATE MEDICAL CARE IF: °· You have suicidal feelings. °· You feel you may harm the baby or someone else. °Document Released: 07/19/2004 Document Revised: 01/07/2012 Document Reviewed: 07/27/2013 °ExitCare® Patient Information ©2014 [employer]. °Mastitis  °Mastitis is inflammation of the breast tissue. It occurs most often in women who are breastfeeding, but it can also affect other women, and even sometimes men. °CAUSES  °Mastitis is usually caused by a bacterial infection. Bacteria enter the breast tissue through cuts or openings in the skin. Typically, this occurs with breastfeeding because of cracked or irritated skin. Sometimes, it can occur even when there is no opening in the skin. It can be associated with plugged milk (lactiferous) ducts. Nipple piercing can also lead to mastitis. Also, some forms of breast cancer can cause mastitis. °SIGNS AND SYMPTOMS  °· Swelling, redness, tenderness, and pain in an area of the breast. °· Swelling of the glands under the arm on the same side. °· Fever. °If an infection is  allowed to progress, a collection of pus (abscess) may develop. °DIAGNOSIS  °Your health care provider can usually diagnose mastitis based on your symptoms and a physical exam. Tests may be done to help confirm the diagnosis. These may include:  °· Removal of pus from the breast by applying pressure to the area. This pus can be examined in the lab to determine which bacteria are present. If an abscess has developed, the fluid in the abscess can be removed with a needle. This can also be used to confirm the diagnosis and determine the bacteria present. In most cases, pus will not be present. °· Blood tests to determine if your body is fighting a bacterial infection. °· Mammogram or ultrasound tests to rule out other problems or diseases. °TREATMENT  °Antibiotic medicine is used to treat a bacterial infection. Your health care provider will determine which bacteria are most likely causing the infection and will select an appropriate antibiotic. This is sometimes changed based on the results of tests performed to identify the bacteria, or if there is no response to the antibiotic selected. Antibiotics are usually given by mouth. You may also be given medicine for pain. °Mastitis that occurs with breastfeeding will sometimes go away on its own, so your health care provider may choose to wait 24 hours after first seeing you to decide whether a prescription medicine is needed. °HOME CARE INSTRUCTIONS  °· Only take over-the-counter or prescription medicines for pain, fever, or discomfort   as directed by your health care provider. °· If your health care provider prescribed an antibiotic, take the medicine as directed. Make sure you finish it even if you start to feel better. °· Do not wear a tight or underwire bra. Wear a soft, supportive bra. °· Increase your fluid intake, especially if you have a fever. °· Women who are breastfeeding should follow these instructions: °· Continue to empty the breast. Your health care  provider can tell you whether this milk is safe for your infant or needs to be thrown out. You may be told to stop nursing until your health care provider thinks it is safe for your baby. Use a breast pump if you are advised to stop nursing. °· Keep your nipples clean and dry. °· Empty the first breast completely before going to the other breast. If your baby is not emptying your breasts completely for some reason, use a breast pump to empty your breasts. °· If you go back to work, pump your breasts while at work to stay in time with your nursing schedule. °· Avoid allowing your breasts to become overly filled with milk (engorged). °SEEK MEDICAL CARE IF:  °· You have pus-like discharge from the breast. °· Your symptoms do not improve with the treatment prescribed by your health care provider within 2 days. °SEEK IMMEDIATE MEDICAL CARE IF:  °· Your pain and swelling are getting worse. °· You have pain that is not controlled with medicine. °· You have a red line extending from the breast toward your armpit. °· You have a fever or persistent symptoms for more than 2 3 days. °· You have a fever and your symptoms suddenly get worse. °Document Released: 10/15/2005 Document Revised: 08/05/2013 Document Reviewed: 05/15/2013 °ExitCare® Patient Information ©2014 [employer]. ° °

## 2014-04-12 NOTE — Progress Notes (Signed)
POD # 3  Subjective: Pt reports feeling well/ Pain controlled with Motrin and Percocet Tolerating po/Voiding without problems/ No n/v/ Flatus present, +BM Activity: ad lib Bleeding is light Newborn info:  Information for the patient's newborn:  Mickie BailRamakrishnan, Girl Mardell [[id number]][[id number]]  female  Feeding: bottle   Objective: VS:  Filed Vitals:   04/11/14 0524 04/11/14 1721 04/12/14 0028 04/12/14 0547  BP: 111/60 122/84 112/62 113/72  Pulse: 82 80 72 61  Temp: 98.1 F (36.7 C) 98.6 F (37 C) 97.6 F (36.4 C) 97.9 F (36.6 C)  TempSrc: Oral Oral Oral Oral  Resp: 18 18 20 18   Weight:      SpO2: 100%  99%     I&O: Intake/Output   None     LABS:   Recent Labs  04/10/14 0623  WBC 11.8*  HGB 12.1  HCT 36.3  PLT 251    Blood type: --/--/A NEG (06/13 0623) Rubella:       Physical Exam:  General: alert and cooperative Abdomen: soft, nontender, normal bowel sounds Uterine Fundus: firm, 2 FB below umbilicus, nontender Incision: Covered with Tegaderm and honeycomb dressing; no edema, bruising, or erythema, small brown drainage to left lateral-outlined with no extension; skin well approximated with suture Lochia: minimal Ext: extremities normal, atraumatic, no cyanosis or edema and Homans sign is negative, no sign of DVT    Assessment/: POD # 3 / G3P2012/ S/P C/Section d/t repeat Doing well  Plan: Continue routine post op orders Discharge home today   Signed: Kenard GowerAWSON, Maimuna Leaman, M, MSN, CNM 04/12/2014, 8:13 AM

## 2014-04-13 NOTE — Discharge Summary (Signed)
Reviewed and agree with note and plan. V.Harshith Pursell, MD  

## 2014-08-28 ENCOUNTER — Ambulatory Visit (INDEPENDENT_AMBULATORY_CARE_PROVIDER_SITE_OTHER): Payer: 59 | Admitting: Emergency Medicine

## 2014-08-28 VITALS — BP 105/73 | HR 94 | Temp 98.4°F | Resp 16 | Ht 62.5 in | Wt 157.6 lb

## 2014-08-28 DIAGNOSIS — J018 Other acute sinusitis: Secondary | ICD-10-CM

## 2014-08-28 DIAGNOSIS — J40 Bronchitis, not specified as acute or chronic: Secondary | ICD-10-CM

## 2014-08-28 DIAGNOSIS — J209 Acute bronchitis, unspecified: Secondary | ICD-10-CM

## 2014-08-28 MED ORDER — PSEUDOEPHEDRINE-GUAIFENESIN ER 60-600 MG PO TB12: 1.0000 | ORAL_TABLET | Freq: Two times a day (BID) | ORAL | Status: DC

## 2014-08-28 MED ORDER — AMOXICILLIN-POT CLAVULANATE 875-125 MG PO TABS: 1.0000 | ORAL_TABLET | Freq: Two times a day (BID) | ORAL | Status: DC

## 2014-08-28 MED ORDER — HYDROCOD POLST-CHLORPHEN POLST 10-8 MG/5ML PO LQCR: 5.0000 mL | Freq: Two times a day (BID) | ORAL | Status: DC | PRN

## 2014-08-28 NOTE — Progress Notes (Signed)
[facility] [address], [place] [id number] [phone]  Date:  08/28/2014   Name:  Carly Gordon   DOB:  20-Apr-1986   MRN:  [id number]  PCP:  No PCP Per Patient    Chief Complaint: Sinusitis   History of Present Illness:  Carly Mccoyishwaryadevi Farrey is a 28 y.o. very pleasant female patient who presents with the following:  2 day history of nasal congestion and drainage that is purulent.  Has post nasal drainage. Sore throat and pain in right ear. Has a cough that is not productive.   No wheezing or shortness of breath No nausea or vomiting.  No stool change or rash. No improvement with over the counter medications or other home remedies.  Denies other complaint or health concern today.   Patient Active Problem List   Diagnosis Date Noted  . Postpartum care following cesarean delivery (6/12) 04/09/2014  . Cesarean delivery delivered 04/09/2014    Past Medical History  Diagnosis Date  . Anemia   . Psoriasis     legs  . Postpartum care following cesarean delivery (6/12) 04/09/2014    Past Surgical History  Procedure Laterality Date  . Cesarean section  02/2012    x 1 [place]  . Cesarean section N/A 04/09/2014    Procedure: Repeat CESAREAN SECTION;  Surgeon: Robley FriesVaishali R [name], MD;  Location: [facility];  Service: Obstetrics;  Laterality: N/A;  EDD: 04/06/14    History  Substance Use Topics  . Smoking status: Never Smoker   . Smokeless tobacco: Never Used  . Alcohol Use: No    Family History  Problem Relation Age of Onset  . Diabetes Mother   . Diabetes Father     No Known Allergies  Medication list has been reviewed and updated.  Current Outpatient Prescriptions on File Prior to Visit  Medication Sig Dispense Refill  . ibuprofen (ADVIL,MOTRIN) 600 MG tablet Take 1 tablet (600 mg total) by mouth every 6 (six) hours.  30 tablet  0  . Prenat-FeFum-FePo-FA-Omega 3 (CONCEPT DHA PO) Take 1 tablet by mouth daily.       No current  facility-administered medications on file prior to visit.    Review of Systems:  As per HPI, otherwise negative.    Physical Examination: Filed Vitals:   08/28/14 1230  BP: 105/73  Pulse: 94  Temp: 98.4 F (36.9 C)  Resp: 16   Filed Vitals:   08/28/14 1230  Height: 5' 2.5" (1.588 m)  Weight: 157 lb 9.6 oz (71.487 kg)   Body mass index is 28.35 kg/(m^2). Ideal Body Weight: Weight in (lb) to have BMI = 25: 138.6  GEN: WDWN, NAD, Non-toxic, A & O x 3 HEENT: Atraumatic, Normocephalic. Neck supple. No masses, No LAD. Ears and Nose: No external deformity. CV: RRR, No M/G/R. No JVD. No thrill. No extra heart sounds. PULM: scant  Wheezes, no crackles, rhonchi. No retractions. No resp. distress. No accessory muscle use. ABD: S, NT, ND, +BS. No rebound. No HSM. EXTR: No c/c/e NEURO Normal gait.  PSYCH: Normally interactive. Conversant. Not depressed or anxious appearing.  Calm demeanor.    Assessment and Plan: Sinusitis Bronchitis augmentin mucinex d tussionex Follow up as needed  Signed,  Phillips OdorJeffery Wyn Nettle, MD

## 2014-08-28 NOTE — Patient Instructions (Signed)
Acute Bronchitis Bronchitis is inflammation of the airways that extend from the windpipe into the lungs (bronchi). The inflammation often causes mucus to develop. This leads to a cough, which is the most common symptom of bronchitis.  In acute bronchitis, the condition usually develops suddenly and goes away over time, usually in a couple weeks. Smoking, allergies, and asthma can make bronchitis worse. Repeated episodes of bronchitis may cause further lung problems.  CAUSES Acute bronchitis is most often caused by the same virus that causes a cold. The virus can spread from person to person (contagious) through coughing, sneezing, and touching contaminated objects. SIGNS AND SYMPTOMS   Cough.   Fever.   Coughing up mucus.   Body aches.   Chest congestion.   Chills.   Shortness of breath.   Sore throat.  DIAGNOSIS  Acute bronchitis is usually diagnosed through a physical exam. Your health care provider will also ask you questions about your medical history. Tests, such as chest X-rays, are sometimes done to rule out other conditions.  TREATMENT  Acute bronchitis usually goes away in a couple weeks. Oftentimes, no medical treatment is necessary. Medicines are sometimes given for relief of fever or cough. Antibiotic medicines are usually not needed but may be prescribed in certain situations. In some cases, an inhaler may be recommended to help reduce shortness of breath and control the cough. A cool mist vaporizer may also be used to help thin bronchial secretions and make it easier to clear the chest.  HOME CARE INSTRUCTIONS  Get plenty of rest.   Drink enough fluids to keep your urine clear or pale yellow (unless you have a medical condition that requires fluid restriction). Increasing fluids may help thin your respiratory secretions (sputum) and reduce chest congestion, and it will prevent dehydration.   Take medicines only as directed by your health care provider.  If  you were prescribed an antibiotic medicine, finish it all even if you start to feel better.  Avoid smoking and secondhand smoke. Exposure to cigarette smoke or irritating chemicals will make bronchitis worse. If you are a smoker, consider using nicotine gum or skin patches to help control withdrawal symptoms. Quitting smoking will help your lungs heal faster.   Reduce the chances of another bout of acute bronchitis by washing your hands frequently, avoiding people with cold symptoms, and trying not to touch your hands to your mouth, nose, or eyes.   Keep all follow-up visits as directed by your health care provider.  SEEK MEDICAL CARE IF: Your symptoms do not improve after 1 week of treatment.  SEEK IMMEDIATE MEDICAL CARE IF:  You develop an increased fever or chills.   You have chest pain.   You have severe shortness of breath.  You have bloody sputum.   You develop dehydration.  You faint or repeatedly feel like you are going to pass out.  You develop repeated vomiting.  You develop a severe headache. MAKE SURE YOU:   Understand these instructions.  Will watch your condition.  Will get help right away if you are not doing well or get worse. Document Released: 11/22/2004 Document Revised: 03/01/2014 Document Reviewed: 04/07/2013 ExitCare Patient Information 2015 [employer]. This information is not intended to replace advice given to you by your health care provider. Make sure you discuss any questions you have with your health care provider. Sinusitis Sinusitis is redness, soreness, and inflammation of the paranasal sinuses. Paranasal sinuses are air pockets within the bones of your face (beneath the   eyes, the middle of the forehead, or above the eyes). In healthy paranasal sinuses, mucus is able to drain out, and air is able to circulate through them by way of your nose. However, when your paranasal sinuses are inflamed, mucus and air can become trapped. This can  allow bacteria and other germs to grow and cause infection. Sinusitis can develop quickly and last only a short time (acute) or continue over a long period (chronic). Sinusitis that lasts for more than 12 weeks is considered chronic.  CAUSES  Causes of sinusitis include:  Allergies.  Structural abnormalities, such as displacement of the cartilage that separates your nostrils (deviated septum), which can decrease the air flow through your nose and sinuses and affect sinus drainage.  Functional abnormalities, such as when the small hairs (cilia) that line your sinuses and help remove mucus do not work properly or are not present. SIGNS AND SYMPTOMS  Symptoms of acute and chronic sinusitis are the same. The primary symptoms are pain and pressure around the affected sinuses. Other symptoms include:  Upper toothache.  Earache.  Headache.  Bad breath.  Decreased sense of smell and taste.  A cough, which worsens when you are lying flat.  Fatigue.  Fever.  Thick drainage from your nose, which often is green and may contain pus (purulent).  Swelling and warmth over the affected sinuses. DIAGNOSIS  Your health care provider will perform a physical exam. During the exam, your health care provider may:  Look in your nose for signs of abnormal growths in your nostrils (nasal polyps).  Tap over the affected sinus to check for signs of infection.  View the inside of your sinuses (endoscopy) using an imaging device that has a light attached (endoscope). If your health care provider suspects that you have chronic sinusitis, one or more of the following tests may be recommended:  Allergy tests.  Nasal culture. A sample of mucus is taken from your nose, sent to a lab, and screened for bacteria.  Nasal cytology. A sample of mucus is taken from your nose and examined by your health care provider to determine if your sinusitis is related to an allergy. TREATMENT  Most cases of acute  sinusitis are related to a viral infection and will resolve on their own within 10 days. Sometimes medicines are prescribed to help relieve symptoms (pain medicine, decongestants, nasal steroid sprays, or saline sprays).  However, for sinusitis related to a bacterial infection, your health care provider will prescribe antibiotic medicines. These are medicines that will help kill the bacteria causing the infection.  Rarely, sinusitis is caused by a fungal infection. In theses cases, your health care provider will prescribe antifungal medicine. For some cases of chronic sinusitis, surgery is needed. Generally, these are cases in which sinusitis recurs more than 3 times per year, despite other treatments. HOME CARE INSTRUCTIONS   Drink plenty of water. Water helps thin the mucus so your sinuses can drain more easily.  Use a humidifier.  Inhale steam 3 to 4 times a day (for example, sit in the bathroom with the shower running).  Apply a warm, moist washcloth to your face 3 to 4 times a day, or as directed by your health care provider.  Use saline nasal sprays to help moisten and clean your sinuses.  Take medicines only as directed by your health care provider.  If you were prescribed either an antibiotic or antifungal medicine, finish it all even if you start to feel better. SEEK IMMEDIATE MEDICAL   CARE IF:  You have increasing pain or severe headaches.  You have nausea, vomiting, or drowsiness.  You have swelling around your face.  You have vision problems.  You have a stiff neck.  You have difficulty breathing. MAKE SURE YOU:   Understand these instructions.  Will watch your condition.  Will get help right away if you are not doing well or get worse. Document Released: 10/15/2005 Document Revised: 03/01/2014 Document Reviewed: 10/30/2011 ExitCare Patient Information 2015 [employer]. This information is not intended to replace advice given to you by your health care provider.  Make sure you discuss any questions you have with your health care provider.  

## 2015-05-24 ENCOUNTER — Ambulatory Visit (INDEPENDENT_AMBULATORY_CARE_PROVIDER_SITE_OTHER): Payer: 59 | Admitting: Family Medicine

## 2015-05-24 VITALS — BP 110/70 | HR 75 | Temp 98.1°F | Resp 16 | Ht 62.75 in | Wt 145.6 lb

## 2015-05-24 DIAGNOSIS — J029 Acute pharyngitis, unspecified: Secondary | ICD-10-CM

## 2015-05-24 DIAGNOSIS — J069 Acute upper respiratory infection, unspecified: Secondary | ICD-10-CM

## 2015-05-24 DIAGNOSIS — H9203 Otalgia, bilateral: Secondary | ICD-10-CM

## 2015-05-24 LAB — POCT RAPID STREP A (OFFICE): Rapid Strep A Screen: NEGATIVE

## 2015-05-24 NOTE — Progress Notes (Signed)
Subjective:    Patient ID: Carly Gordon, female    DOB: [birth year], 29 y.o.   MRN: [id number]  HPI Carly Gordon is a 29 y.o. female  Feeling feverish since last night, but no fever measured, sore throat and bilateral ear pain. Headache when leaning forward. Positive sick contacts - kids with colds and coughs. Worse sx - throat pain. All symptoms started last night. Slight nasal congestion, no cough.   No chronic med problems, no rx meds.,   Tx: none.    Patient Active Problem List   Diagnosis Date Noted  . Postpartum care following cesarean delivery (6/12) 04/09/2014  . Cesarean delivery delivered 04/09/2014   Past Medical History  Diagnosis Date  . Anemia   . Psoriasis     legs  . Postpartum care following cesarean delivery (6/12) 04/09/2014   Past Surgical History  Procedure Laterality Date  . Cesarean section  02/2012    x 1 [place]  . Cesarean section N/A 04/09/2014    Procedure: Repeat CESAREAN SECTION;  Surgeon: Robley Fries, MD;  Location: [facility];  Service: Obstetrics;  Laterality: N/A;  EDD: 04/06/14   No Known Allergies Prior to Admission medications   Medication Sig Start Date End Date Taking? Authorizing Provider  ibuprofen (ADVIL,MOTRIN) 600 MG tablet Take 1 tablet (600 mg total) by mouth every 6 (six) hours. Patient not taking: Reported on 05/24/2015 04/12/14   Raelyn Mora, CNM   History   Social History  . Marital Status: Married    Spouse Name: N/A  . Number of Children: N/A  . Years of Education: N/A   Occupational History  . Not on file.   Social History Main Topics  . Smoking status: Never Smoker   . Smokeless tobacco: Never Used  . Alcohol Use: No  . Drug Use: No  . Sexual Activity: Yes    Birth Control/ Protection: None     Comment: pregnant   Other Topics Concern  . Not on file   Social History Narrative       Review of Systems  Constitutional: Positive for fever (subjective) and chills.  HENT:  Positive for congestion, ear pain and sneezing. Negative for mouth sores, postnasal drip and trouble swallowing.   Respiratory: Negative for cough.        Objective:   Physical Exam  Constitutional: She is oriented to person, place, and time. She appears well-developed and well-nourished. No distress.  HENT:  Head: Normocephalic and atraumatic.  Right Ear: Hearing, tympanic membrane, external ear and ear canal normal.  Left Ear: Hearing, tympanic membrane, external ear and ear canal normal.  Nose: Nose normal.  Mouth/Throat: Oropharynx is clear and moist. No oropharyngeal exudate.  Minimal edematous turbinates.   Eyes: Conjunctivae and EOM are normal. Pupils are equal, round, and reactive to light.  Cardiovascular: Normal rate, regular rhythm, normal heart sounds and intact distal pulses.   No murmur heard. Pulmonary/Chest: Effort normal and breath sounds normal. No respiratory distress. She has no wheezes. She has no rhonchi.  Lymphadenopathy:    She has no cervical adenopathy.  Neurological: She is alert and oriented to person, place, and time.  Skin: Skin is warm and dry. No rash noted.  Psychiatric: She has a normal mood and affect. Her behavior is normal.  Vitals reviewed.  Filed Vitals:   05/24/15 1225  BP: 110/70  Pulse: 75  Temp: 98.1 F (36.7 C)  TempSrc: Oral  Resp: 16  Height: 5' 2.75" (1.594 m)  Weight:  145 lb 9.3 oz (66.035 kg)  SpO2: 98%   Results for orders placed or performed in visit on 05/24/15  POCT rapid strep A  Result Value Ref Range   Rapid Strep A Screen Negative Negative      Assessment & Plan:  [name] is a 29 y.o. female Acute upper respiratory infection - Plan: Throat culture ([facility])  Sore throat - Plan: POCT rapid strep A, Throat culture ([facility])  Ear pain, bilateral  Suspected viral upper respiratory infection with sore throat and ear pain. Reassuring exam, negative rapid strep test. Symptomatic care discussed,  see AVS.  Throat culture pending, but suspect this will also be negative. RTC precautions discussed.   No orders of the defined types were placed in this encounter.   Patient Instructions  Advil or Aleve over-the-counter as needed for ear pain, Cepacol or other cough drops or sore throat lozenges for sore throat. See other information typed below. We will check a throat culture but I do not think you have strep throat, so no antibiotics necessary at this time. Return to the clinic or go to the nearest emergency room if any of your symptoms worsen or new symptoms occur.  Sore Throat A sore throat is pain, burning, irritation, or scratchiness of the throat. There is often pain or tenderness when swallowing or talking. A sore throat may be accompanied by other symptoms, such as coughing, sneezing, fever, and swollen neck glands. A sore throat is often the first sign of another sickness, such as a cold, flu, strep throat, or mononucleosis (commonly known as mono). Most sore throats go away without medical treatment. CAUSES  The most common causes of a sore throat include:  A viral infection, such as a cold, flu, or mono.  A bacterial infection, such as strep throat, tonsillitis, or whooping cough.  Seasonal allergies.  Dryness in the air.  Irritants, such as smoke or pollution.  Gastroesophageal reflux disease (GERD). HOME CARE INSTRUCTIONS   Only take over-the-counter medicines as directed by your caregiver.  Drink enough fluids to keep your urine clear or pale yellow.  Rest as needed.  Try using throat sprays, lozenges, or sucking on hard candy to ease any pain (if older than 4 years or as directed).  Sip warm liquids, such as broth, herbal tea, or warm water with honey to relieve pain temporarily. You may also eat or drink cold or frozen liquids such as frozen ice pops.  Gargle with salt water (mix 1 tsp salt with 8 oz of water).  Do not smoke and avoid secondhand smoke.  Put a  cool-mist humidifier in your bedroom at night to moisten the air. You can also turn on a hot shower and sit in the bathroom with the door closed for 5-10 minutes. SEEK IMMEDIATE MEDICAL CARE IF:  You have difficulty breathing.  You are unable to swallow fluids, soft foods, or your saliva.  You have increased swelling in the throat.  Your sore throat does not get better in 7 days.  You have nausea and vomiting.  You have a fever or persistent symptoms for more than 2-3 days.  You have a fever and your symptoms suddenly get worse. MAKE SURE YOU:   Understand these instructions.  Will watch your condition.  Will get help right away if you are not doing well or get worse. Document Released: 11/22/2004 Document Revised: 10/01/2012 Document Reviewed: 06/22/2012 [facility] Patient Information 2015 [place], [place]. This information is not intended to replace advice given  to you by your health care provider. Make sure you discuss any questions you have with your health care provider.   Otalgia The most common reason for this in children is an infection of the middle ear. Pain from the middle ear is usually caused by a build-up of fluid and pressure behind the eardrum. Pain from an earache can be sharp, dull, or burning. The pain may be temporary or constant. The middle ear is connected to the nasal passages by a short narrow tube called the Eustachian tube. The Eustachian tube allows fluid to drain out of the middle ear, and helps keep the pressure in your ear equalized. CAUSES  A cold or allergy can block the Eustachian tube with inflammation and the build-up of secretions. This is especially likely in small children, because their Eustachian tube is shorter and more horizontal. When the Eustachian tube closes, the normal flow of fluid from the middle ear is stopped. Fluid can accumulate and cause stuffiness, pain, hearing loss, and an ear infection if germs start growing in this area. SYMPTOMS    The symptoms of an ear infection may include fever, ear pain, fussiness, increased crying, and irritability. Many children will have temporary and minor hearing loss during and right after an ear infection. Permanent hearing loss is rare, but the risk increases the more infections a child has. Other causes of ear pain include retained water in the outer ear canal from swimming and bathing. Ear pain in adults is less likely to be from an ear infection. Ear pain may be referred from other locations. Referred pain may be from the joint between your jaw and the skull. It may also come from a tooth problem or problems in the neck. Other causes of ear pain include:  A foreign body in the ear.  Outer ear infection.  Sinus infections.  Impacted ear wax.  Ear injury.  Arthritis of the jaw or TMJ problems.  Middle ear infection.  Tooth infections.  Sore throat with pain to the ears. DIAGNOSIS  Your caregiver can usually make the diagnosis by examining you. Sometimes other special studies, including x-rays and lab work may be necessary. TREATMENT   If antibiotics were prescribed, use them as directed and finish them even if you or your child's symptoms seem to be improved.  Sometimes PE tubes are needed in children. These are little plastic tubes which are put into the eardrum during a simple surgical procedure. They allow fluid to drain easier and allow the pressure in the middle ear to equalize. This helps relieve the ear pain caused by pressure changes. HOME CARE INSTRUCTIONS   Only take over-the-counter or prescription medicines for pain, discomfort, or fever as directed by your caregiver. DO NOT GIVE CHILDREN ASPIRIN because of the association of Reye's Syndrome in children taking aspirin.  Use a cold pack applied to the outer ear for 15-20 minutes, 03-04 times per day or as needed may reduce pain. Do not apply ice directly to the skin. You may cause frost bite.  Over-the-counter ear  drops used as directed may be effective. Your caregiver may sometimes prescribe ear drops.  Resting in an upright position may help reduce pressure in the middle ear and relieve pain.  Ear pain caused by rapidly descending from high altitudes can be relieved by swallowing or chewing gum. Allowing infants to suck on a bottle during airplane travel can help.  Do not smoke in the house or near children. If you are unable to quit  smoking, smoke outside.  Control allergies. SEEK IMMEDIATE MEDICAL CARE IF:   You or your child are becoming sicker.  Pain or fever relief is not obtained with medicine.  You or your child's symptoms (pain, fever, or irritability) do not improve within 24 to 48 hours or as instructed.  Severe pain suddenly stops hurting. This may indicate a ruptured eardrum.  You or your children develop new problems such as severe headaches, stiff neck, difficulty swallowing, or swelling of the face or around the ear. Document Released: 06/01/2004 Document Revised: 01/07/2012 Document Reviewed: 10/06/2008 [facility] Patient Information 2015 [place], [place]. This information is not intended to replace advice given to you by your health care provider. Make sure you discuss any questions you have with your health care provider.  Upper Respiratory Infection, Adult An upper respiratory infection (URI) is also sometimes known as the common cold. The upper respiratory tract includes the nose, sinuses, throat, trachea, and bronchi. Bronchi are the airways leading to the lungs. Most people improve within 1 week, but symptoms can last up to 2 weeks. A residual cough may last even longer.  CAUSES Many different viruses can infect the tissues lining the upper respiratory tract. The tissues become irritated and inflamed and often become very moist. Mucus production is also common. A cold is contagious. You can easily spread the virus to others by oral contact. This includes kissing, sharing a glass,  coughing, or sneezing. Touching your mouth or nose and then touching a surface, which is then touched by another person, can also spread the virus. SYMPTOMS  Symptoms typically develop 1 to 3 days after you come in contact with a cold virus. Symptoms vary from person to person. They may include:  Runny nose.  Sneezing.  Nasal congestion.  Sinus irritation.  Sore throat.  Loss of voice (laryngitis).  Cough.  Fatigue.  Muscle aches.  Loss of appetite.  Headache.  Low-grade fever. DIAGNOSIS  You might diagnose your own cold based on familiar symptoms, since most people get a cold 2 to 3 times a year. Your caregiver can confirm this based on your exam. Most importantly, your caregiver can check that your symptoms are not due to another disease such as strep throat, sinusitis, pneumonia, asthma, or epiglottitis. Blood tests, throat tests, and X-rays are not necessary to diagnose a common cold, but they may sometimes be helpful in excluding other more serious diseases. Your caregiver will decide if any further tests are required. RISKS AND COMPLICATIONS  You may be at risk for a more severe case of the common cold if you smoke cigarettes, have chronic heart disease (such as heart failure) or lung disease (such as asthma), or if you have a weakened immune system. The very young and very old are also at risk for more serious infections. Bacterial sinusitis, middle ear infections, and bacterial pneumonia can complicate the common cold. The common cold can worsen asthma and chronic obstructive pulmonary disease (COPD). Sometimes, these complications can require emergency medical care and may be life-threatening. PREVENTION  The best way to protect against getting a cold is to practice good hygiene. Avoid oral or hand contact with people with cold symptoms. Wash your hands often if contact occurs. There is no clear evidence that vitamin C, vitamin E, echinacea, or exercise reduces the chance of  developing a cold. However, it is always recommended to get plenty of rest and practice good nutrition. TREATMENT  Treatment is directed at relieving symptoms. There is no cure. Antibiotics are  not effective, because the infection is caused by a virus, not by bacteria. Treatment may include:  Increased fluid intake. Sports drinks offer valuable electrolytes, sugars, and fluids.  Breathing heated mist or steam (vaporizer or shower).  Eating chicken soup or other clear broths, and maintaining good nutrition.  Getting plenty of rest.  Using gargles or lozenges for comfort.  Controlling fevers with ibuprofen or acetaminophen as directed by your caregiver.  Increasing usage of your inhaler if you have asthma. Zinc gel and zinc lozenges, taken in the first 24 hours of the common cold, can shorten the duration and lessen the severity of symptoms. Pain medicines may help with fever, muscle aches, and throat pain. A variety of non-prescription medicines are available to treat congestion and runny nose. Your caregiver can make recommendations and may suggest nasal or lung inhalers for other symptoms.  HOME CARE INSTRUCTIONS   Only take over-the-counter or prescription medicines for pain, discomfort, or fever as directed by your caregiver.  Use a warm mist humidifier or inhale steam from a shower to increase air moisture. This may keep secretions moist and make it easier to breathe.  Drink enough water and fluids to keep your urine clear or pale yellow.  Rest as needed.  Return to work when your temperature has returned to normal or as your caregiver advises. You may need to stay home longer to avoid infecting others. You can also use a face mask and careful hand washing to prevent spread of the virus. SEEK MEDICAL CARE IF:   After the first few days, you feel you are getting worse rather than better.  You need your caregiver's advice about medicines to control symptoms.  You develop chills,  worsening shortness of breath, or brown or red sputum. These may be signs of pneumonia.  You develop yellow or brown nasal discharge or pain in the face, especially when you bend forward. These may be signs of sinusitis.  You develop a fever, swollen neck glands, pain with swallowing, or white areas in the back of your throat. These may be signs of strep throat. SEEK IMMEDIATE MEDICAL CARE IF:   You have a fever.  You develop severe or persistent headache, ear pain, sinus pain, or chest pain.  You develop wheezing, a prolonged cough, cough up blood, or have a change in your usual mucus (if you have chronic lung disease).  You develop sore muscles or a stiff neck. Document Released: 04/10/2001 Document Revised: 01/07/2012 Document Reviewed: 01/20/2014 [facility] Patient Information 2015 [place], [place]. This information is not intended to replace advice given to you by your health care provider. Make sure you discuss any questions you have with your health care provider.

## 2015-05-24 NOTE — Patient Instructions (Signed)
Advil or Aleve over-the-counter as needed for ear pain, Cepacol or other cough drops or sore throat lozenges for sore throat. See other information typed below. We will check a throat culture but I do not think you have strep throat, so no antibiotics necessary at this time. Return to the clinic or go to the nearest emergency room if any of your symptoms worsen or new symptoms occur.  Sore Throat A sore throat is pain, burning, irritation, or scratchiness of the throat. There is often pain or tenderness when swallowing or talking. A sore throat may be accompanied by other symptoms, such as coughing, sneezing, fever, and swollen neck glands. A sore throat is often the first sign of another sickness, such as a cold, flu, strep throat, or mononucleosis (commonly known as mono). Most sore throats go away without medical treatment. CAUSES  The most common causes of a sore throat include:  A viral infection, such as a cold, flu, or mono.  A bacterial infection, such as strep throat, tonsillitis, or whooping cough.  Seasonal allergies.  Dryness in the air.  Irritants, such as smoke or pollution.  Gastroesophageal reflux disease (GERD). HOME CARE INSTRUCTIONS   Only take over-the-counter medicines as directed by your caregiver.  Drink enough fluids to keep your urine clear or pale yellow.  Rest as needed.  Try using throat sprays, lozenges, or sucking on hard candy to ease any pain (if older than 4 years or as directed).  Sip warm liquids, such as broth, herbal tea, or warm water with honey to relieve pain temporarily. You may also eat or drink cold or frozen liquids such as frozen ice pops.  Gargle with salt water (mix 1 tsp salt with 8 oz of water).  Do not smoke and avoid secondhand smoke.  Put a cool-mist humidifier in your bedroom at night to moisten the air. You can also turn on a hot shower and sit in the bathroom with the door closed for 5-10 minutes. SEEK IMMEDIATE MEDICAL CARE  IF:  You have difficulty breathing.  You are unable to swallow fluids, soft foods, or your saliva.  You have increased swelling in the throat.  Your sore throat does not get better in 7 days.  You have nausea and vomiting.  You have a fever or persistent symptoms for more than 2-3 days.  You have a fever and your symptoms suddenly get worse. MAKE SURE YOU:   Understand these instructions.  Will watch your condition.  Will get help right away if you are not doing well or get worse. Document Released: 11/22/2004 Document Revised: 10/01/2012 Document Reviewed: 06/22/2012 [facility] Patient Information 2015 [place], [place]. This information is not intended to replace advice given to you by your health care provider. Make sure you discuss any questions you have with your health care provider.   Otalgia The most common reason for this in children is an infection of the middle ear. Pain from the middle ear is usually caused by a build-up of fluid and pressure behind the eardrum. Pain from an earache can be sharp, dull, or burning. The pain may be temporary or constant. The middle ear is connected to the nasal passages by a short narrow tube called the Eustachian tube. The Eustachian tube allows fluid to drain out of the middle ear, and helps keep the pressure in your ear equalized. CAUSES  A cold or allergy can block the Eustachian tube with inflammation and the build-up of secretions. This is especially likely in small  children, because their Eustachian tube is shorter and more horizontal. When the Eustachian tube closes, the normal flow of fluid from the middle ear is stopped. Fluid can accumulate and cause stuffiness, pain, hearing loss, and an ear infection if germs start growing in this area. SYMPTOMS  The symptoms of an ear infection may include fever, ear pain, fussiness, increased crying, and irritability. Many children will have temporary and minor hearing loss during and right after  an ear infection. Permanent hearing loss is rare, but the risk increases the more infections a child has. Other causes of ear pain include retained water in the outer ear canal from swimming and bathing. Ear pain in adults is less likely to be from an ear infection. Ear pain may be referred from other locations. Referred pain may be from the joint between your jaw and the skull. It may also come from a tooth problem or problems in the neck. Other causes of ear pain include:  A foreign body in the ear.  Outer ear infection.  Sinus infections.  Impacted ear wax.  Ear injury.  Arthritis of the jaw or TMJ problems.  Middle ear infection.  Tooth infections.  Sore throat with pain to the ears. DIAGNOSIS  Your caregiver can usually make the diagnosis by examining you. Sometimes other special studies, including x-rays and lab work may be necessary. TREATMENT   If antibiotics were prescribed, use them as directed and finish them even if you or your child's symptoms seem to be improved.  Sometimes PE tubes are needed in children. These are little plastic tubes which are put into the eardrum during a simple surgical procedure. They allow fluid to drain easier and allow the pressure in the middle ear to equalize. This helps relieve the ear pain caused by pressure changes. HOME CARE INSTRUCTIONS   Only take over-the-counter or prescription medicines for pain, discomfort, or fever as directed by your caregiver. DO NOT GIVE CHILDREN ASPIRIN because of the association of Reye's Syndrome in children taking aspirin.  Use a cold pack applied to the outer ear for 15-20 minutes, 03-04 times per day or as needed may reduce pain. Do not apply ice directly to the skin. You may cause frost bite.  Over-the-counter ear drops used as directed may be effective. Your caregiver may sometimes prescribe ear drops.  Resting in an upright position may help reduce pressure in the middle ear and relieve pain.  Ear  pain caused by rapidly descending from high altitudes can be relieved by swallowing or chewing gum. Allowing infants to suck on a bottle during airplane travel can help.  Do not smoke in the house or near children. If you are unable to quit smoking, smoke outside.  Control allergies. SEEK IMMEDIATE MEDICAL CARE IF:   You or your child are becoming sicker.  Pain or fever relief is not obtained with medicine.  You or your child's symptoms (pain, fever, or irritability) do not improve within 24 to 48 hours or as instructed.  Severe pain suddenly stops hurting. This may indicate a ruptured eardrum.  You or your children develop new problems such as severe headaches, stiff neck, difficulty swallowing, or swelling of the face or around the ear. Document Released: 06/01/2004 Document Revised: 01/07/2012 Document Reviewed: 10/06/2008 [facility] Patient Information 2015 [place], [place]. This information is not intended to replace advice given to you by your health care provider. Make sure you discuss any questions you have with your health care provider.  Upper Respiratory Infection, Adult  An upper respiratory infection (URI) is also sometimes known as the common cold. The upper respiratory tract includes the nose, sinuses, throat, trachea, and bronchi. Bronchi are the airways leading to the lungs. Most people improve within 1 week, but symptoms can last up to 2 weeks. A residual cough may last even longer.  CAUSES Many different viruses can infect the tissues lining the upper respiratory tract. The tissues become irritated and inflamed and often become very moist. Mucus production is also common. A cold is contagious. You can easily spread the virus to others by oral contact. This includes kissing, sharing a glass, coughing, or sneezing. Touching your mouth or nose and then touching a surface, which is then touched by another person, can also spread the virus. SYMPTOMS  Symptoms typically develop 1 to  3 days after you come in contact with a cold virus. Symptoms vary from person to person. They may include:  Runny nose.  Sneezing.  Nasal congestion.  Sinus irritation.  Sore throat.  Loss of voice (laryngitis).  Cough.  Fatigue.  Muscle aches.  Loss of appetite.  Headache.  Low-grade fever. DIAGNOSIS  You might diagnose your own cold based on familiar symptoms, since most people get a cold 2 to 3 times a year. Your caregiver can confirm this based on your exam. Most importantly, your caregiver can check that your symptoms are not due to another disease such as strep throat, sinusitis, pneumonia, asthma, or epiglottitis. Blood tests, throat tests, and X-rays are not necessary to diagnose a common cold, but they may sometimes be helpful in excluding other more serious diseases. Your caregiver will decide if any further tests are required. RISKS AND COMPLICATIONS  You may be at risk for a more severe case of the common cold if you smoke cigarettes, have chronic heart disease (such as heart failure) or lung disease (such as asthma), or if you have a weakened immune system. The very young and very old are also at risk for more serious infections. Bacterial sinusitis, middle ear infections, and bacterial pneumonia can complicate the common cold. The common cold can worsen asthma and chronic obstructive pulmonary disease (COPD). Sometimes, these complications can require emergency medical care and may be life-threatening. PREVENTION  The best way to protect against getting a cold is to practice good hygiene. Avoid oral or hand contact with people with cold symptoms. Wash your hands often if contact occurs. There is no clear evidence that vitamin C, vitamin E, echinacea, or exercise reduces the chance of developing a cold. However, it is always recommended to get plenty of rest and practice good nutrition. TREATMENT  Treatment is directed at relieving symptoms. There is no cure. Antibiotics  are not effective, because the infection is caused by a virus, not by bacteria. Treatment may include:  Increased fluid intake. Sports drinks offer valuable electrolytes, sugars, and fluids.  Breathing heated mist or steam (vaporizer or shower).  Eating chicken soup or other clear broths, and maintaining good nutrition.  Getting plenty of rest.  Using gargles or lozenges for comfort.  Controlling fevers with ibuprofen or acetaminophen as directed by your caregiver.  Increasing usage of your inhaler if you have asthma. Zinc gel and zinc lozenges, taken in the first 24 hours of the common cold, can shorten the duration and lessen the severity of symptoms. Pain medicines may help with fever, muscle aches, and throat pain. A variety of non-prescription medicines are available to treat congestion and runny nose. Your caregiver can make recommendations  and may suggest nasal or lung inhalers for other symptoms.  HOME CARE INSTRUCTIONS   Only take over-the-counter or prescription medicines for pain, discomfort, or fever as directed by your caregiver.  Use a warm mist humidifier or inhale steam from a shower to increase air moisture. This may keep secretions moist and make it easier to breathe.  Drink enough water and fluids to keep your urine clear or pale yellow.  Rest as needed.  Return to work when your temperature has returned to normal or as your caregiver advises. You may need to stay home longer to avoid infecting others. You can also use a face mask and careful hand washing to prevent spread of the virus. SEEK MEDICAL CARE IF:   After the first few days, you feel you are getting worse rather than better.  You need your caregiver's advice about medicines to control symptoms.  You develop chills, worsening shortness of breath, or brown or red sputum. These may be signs of pneumonia.  You develop yellow or brown nasal discharge or pain in the face, especially when you bend forward.  These may be signs of sinusitis.  You develop a fever, swollen neck glands, pain with swallowing, or white areas in the back of your throat. These may be signs of strep throat. SEEK IMMEDIATE MEDICAL CARE IF:   You have a fever.  You develop severe or persistent headache, ear pain, sinus pain, or chest pain.  You develop wheezing, a prolonged cough, cough up blood, or have a change in your usual mucus (if you have chronic lung disease).  You develop sore muscles or a stiff neck. Document Released: 04/10/2001 Document Revised: 01/07/2012 Document Reviewed: 01/20/2014 [facility] Patient Information 2015 [place], [place]. This information is not intended to replace advice given to you by your health care provider. Make sure you discuss any questions you have with your health care provider.

## 2015-05-26 LAB — CULTURE, GROUP A STREP: ORGANISM ID, BACTERIA: NORMAL

## 2015-06-16 ENCOUNTER — Encounter: Payer: Self-pay | Admitting: *Deleted

## 2015-06-16 ENCOUNTER — Ambulatory Visit (INDEPENDENT_AMBULATORY_CARE_PROVIDER_SITE_OTHER): Payer: 59 | Admitting: Family Medicine

## 2015-06-16 VITALS — BP 112/70 | HR 70 | Temp 98.6°F | Resp 16 | Ht 63.0 in | Wt 142.2 lb

## 2015-06-16 DIAGNOSIS — Z8719 Personal history of other diseases of the digestive system: Secondary | ICD-10-CM | POA: Diagnosis not present

## 2015-06-16 DIAGNOSIS — R21 Rash and other nonspecific skin eruption: Secondary | ICD-10-CM

## 2015-06-16 DIAGNOSIS — R1011 Right upper quadrant pain: Secondary | ICD-10-CM

## 2015-06-16 LAB — COMPREHENSIVE METABOLIC PANEL
ALBUMIN: 4.7 g/dL (ref 3.6–5.1)
ALK PHOS: 51 U/L (ref 33–115)
ALT: 17 U/L (ref 6–29)
AST: 17 U/L (ref 10–30)
BUN: 6 mg/dL — ABNORMAL LOW (ref 7–25)
CALCIUM: 9.6 mg/dL (ref 8.6–10.2)
CHLORIDE: 104 mmol/L (ref 98–110)
CO2: 24 mmol/L (ref 20–31)
Creat: 0.68 mg/dL (ref 0.50–1.10)
Glucose, Bld: 93 mg/dL (ref 65–99)
POTASSIUM: 4.4 mmol/L (ref 3.5–5.3)
Sodium: 134 mmol/L — ABNORMAL LOW (ref 135–146)
TOTAL PROTEIN: 7.4 g/dL (ref 6.1–8.1)
Total Bilirubin: 0.5 mg/dL (ref 0.2–1.2)

## 2015-06-16 LAB — POCT CBC
Granulocyte percent: 73 %G (ref 37–80)
HCT, POC: 41.8 % (ref 37.7–47.9)
Hemoglobin: 12.6 g/dL (ref 12.2–16.2)
LYMPH, POC: 1.7 (ref 0.6–3.4)
MCH, POC: 23.4 pg — AB (ref 27–31.2)
MCHC: 30.2 g/dL — AB (ref 31.8–35.4)
MCV: 77.4 fL — AB (ref 80–97)
MID (CBC): 0.4 (ref 0–0.9)
MPV: 7.3 fL (ref 0–99.8)
POC GRANULOCYTE: 5.6 (ref 2–6.9)
POC LYMPH %: 21.7 % (ref 10–50)
POC MID %: 5.3 % (ref 0–12)
Platelet Count, POC: 305 10*3/uL (ref 142–424)
RBC: 5.4 M/uL (ref 4.04–5.48)
RDW, POC: 13 %
WBC: 7.7 10*3/uL (ref 4.6–10.2)

## 2015-06-16 LAB — POCT URINALYSIS DIPSTICK
Bilirubin, UA: NEGATIVE
GLUCOSE UA: NEGATIVE
NITRITE UA: NEGATIVE
PROTEIN UA: NEGATIVE
UROBILINOGEN UA: 0.2
pH, UA: 5.5

## 2015-06-16 LAB — POCT SKIN KOH: SKIN KOH, POC: NEGATIVE

## 2015-06-16 MED ORDER — CLOBETASOL PROPIONATE 0.05 % EX OINT: 1.0000 "application " | TOPICAL_OINTMENT | Freq: Two times a day (BID) | CUTANEOUS | Status: DC

## 2015-06-16 NOTE — Progress Notes (Signed)
Subjective:  Patient ID: Carly Gordon, female    DOB: 1986/01/06  Age: 29 y.o. MRN: [id number] 29 year old female who presents with history of having been in [place] 2 months ago. She got a physical examination. In the screening process they did an ultrasound of her abdomen and told her she had gallstones. She's never had any symptoms. They gave her a medication to take. This morning about 5 AM she awakened with right upper quadrant abdominal pain. She took some milk and pain medication and some salt. She vomited once. The pain is gone away now. She has not been having these pains before. She is gravida 2 para 2  She also has a rash on her feet. She apparently saw a dermatologist in [place] in the past and has been using clobetasol ointment which she is now out of and the rash is getting worse and itches a lot.   Objective:   Neck supple without nodes or thyromegaly. Throat clear but a little polyp is on the right side of the uvula. Her chest is clear to auscultation. Heart regular without murmurs. Abdomen has normal bowel sounds. Soft without organomegaly masses or tenderness. 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 Skin scraping taken.  Assessment & Plan:   Assessment:  Right upper quadrant abdominal pain History of gallstones, asymptomatic previously Rash on feet  Plan:  Skin scraping, CBC, CMP, urine dip. We'll probably go ahead and order another gallbladder ultrasound.  Results for orders placed or performed in visit on 06/16/15  POCT urinalysis dipstick  Result Value Ref Range   Color, UA Amber    Clarity, UA Slightly cloudy    Glucose, UA Negative    Bilirubin, UA Negative    Ketones, UA Trace    Spec Grav, UA >=1.030    Blood, UA Trace-lysed    pH, UA 5.5    Protein, UA Negative    Urobilinogen, UA 0.2    Nitrite, UA Negative    Leukocytes, UA Trace (A) Negative  POCT CBC  Result Value Ref Range   WBC 7.7 4.6 - 10.2 K/uL   Lymph, poc  1.7 0.6 - 3.4   POC LYMPH PERCENT 21.7 10 - 50 %L   MID (cbc) 0.4 0 - 0.9   POC MID % 5.3 0 - 12 %M   POC Granulocyte 5.6 2 - 6.9   Granulocyte percent 73.0 37 - 80 %G   RBC 5.4 4.04 - 5.48 M/uL   Hemoglobin 12.6 12.2 - 16.2 g/dL   HCT, POC 56.3 87.5 - 47.9 %   MCV 77.4 (A) 80 - 97 fL   MCH, POC 23.4 (A) 27 - 31.2 pg   MCHC 30.2 (A) 31.8 - 35.4 g/dL   RDW, POC 64.3 %   Platelet Count, POC 305.0 142 - 424 K/uL   MPV 7.3 0 - 99.8 fL  POCT Skin KOH  Result Value Ref Range   Skin KOH, POC Negative      Patient Instructions  You'll be contacted about when you are to go get the gallbladder ultrasound. If you do not hear from [place] by the first part of the week regarding the scheduling of the ultrasound please call back and speak to referrals.  Return at any time if acutely worse  Use the clobetasol ointment on the rash on the feet twice daily as needed  Avoid excessive fried and fatty foods that might trigger gallbladder problems.Cholelithiasis Cholelithiasis (also called gallstones) is a form of  gallbladder disease in which gallstones form in your gallbladder. The gallbladder is an organ that stores bile made in the liver, which helps digest fats. Gallstones begin as small crystals and slowly grow into stones. Gallstone pain occurs when the gallbladder spasms and a gallstone is blocking the duct. Pain can also occur when a stone passes out of the duct.  RISK FACTORS  Being female.   Having multiple pregnancies. Health care providers sometimes advise removing diseased gallbladders before future pregnancies.   Being obese.  Eating a diet heavy in fried foods and fat.   Being older than 60 years and increasing age.   Prolonged use of medicines containing female hormones.   Having diabetes mellitus.   Rapidly losing weight.   Having a family history of gallstones (heredity).  SYMPTOMS  Nausea.   Vomiting.  Abdominal pain.   Yellowing of the skin (jaundice).    Sudden pain. It may persist from several minutes to several hours.  Fever.   Tenderness to the touch. In some cases, when gallstones do not move into the bile duct, people have no pain or symptoms. These are called "silent" gallstones.  TREATMENT Silent gallstones do not need treatment. In severe cases, emergency surgery may be required. Options for treatment include:  Surgery to remove the gallbladder. This is the most common treatment.  Medicines. These do not always work and may take 6-12 months or more to work.  Shock wave treatment (extracorporeal biliary lithotripsy). In this treatment an ultrasound machine sends shock waves to the gallbladder to break gallstones into smaller pieces that can pass into the intestines or be dissolved by medicine. HOME CARE INSTRUCTIONS   Only take over-the-counter or prescription medicines for pain, discomfort, or fever as directed by your health care provider.   Follow a low-fat diet until seen again by your health care provider. Fat causes the gallbladder to contract, which can result in pain.   Follow up with your health care provider as directed. Attacks are almost always recurrent and surgery is usually required for permanent treatment.  SEEK IMMEDIATE MEDICAL CARE IF:   Your pain increases and is not controlled by medicines.   You have a fever or persistent symptoms for more than 2-3 days.   You have a fever and your symptoms suddenly get worse.   You have persistent nausea and vomiting.  MAKE SURE YOU:   Understand these instructions.  Will watch your condition.  Will get help right away if you are not doing well or get worse. Document Released: 10/11/2005 Document Revised: 06/17/2013 Document Reviewed: 04/08/2013 [facility] Patient Information 2015 [place], [place]. This information is not intended to replace advice given to you by your health care provider. Make sure you discuss any questions you have with your health  care provider.      Daris Harkins, MD 06/16/2015

## 2015-06-16 NOTE — Patient Instructions (Signed)
You'll be contacted about when you are to go get the gallbladder ultrasound. If you do not hear from [place] by the first part of the week regarding the scheduling of the ultrasound please call back and speak to referrals.  Return at any time if acutely worse  Use the clobetasol ointment on the rash on the feet twice daily as needed  Avoid excessive fried and fatty foods that might trigger gallbladder problems.Cholelithiasis Cholelithiasis (also called gallstones) is a form of gallbladder disease in which gallstones form in your gallbladder. The gallbladder is an organ that stores bile made in the liver, which helps digest fats. Gallstones begin as small crystals and slowly grow into stones. Gallstone pain occurs when the gallbladder spasms and a gallstone is blocking the duct. Pain can also occur when a stone passes out of the duct.  RISK FACTORS  Being female.   Having multiple pregnancies. Health care providers sometimes advise removing diseased gallbladders before future pregnancies.   Being obese.  Eating a diet heavy in fried foods and fat.   Being older than 60 years and increasing age.   Prolonged use of medicines containing female hormones.   Having diabetes mellitus.   Rapidly losing weight.   Having a family history of gallstones (heredity).  SYMPTOMS  Nausea.   Vomiting.  Abdominal pain.   Yellowing of the skin (jaundice).   Sudden pain. It may persist from several minutes to several hours.  Fever.   Tenderness to the touch. In some cases, when gallstones do not move into the bile duct, people have no pain or symptoms. These are called "silent" gallstones.  TREATMENT Silent gallstones do not need treatment. In severe cases, emergency surgery may be required. Options for treatment include:  Surgery to remove the gallbladder. This is the most common treatment.  Medicines. These do not always work and may take 6-12 months or more to work.  Shock wave  treatment (extracorporeal biliary lithotripsy). In this treatment an ultrasound machine sends shock waves to the gallbladder to break gallstones into smaller pieces that can pass into the intestines or be dissolved by medicine. HOME CARE INSTRUCTIONS   Only take over-the-counter or prescription medicines for pain, discomfort, or fever as directed by your health care provider.   Follow a low-fat diet until seen again by your health care provider. Fat causes the gallbladder to contract, which can result in pain.   Follow up with your health care provider as directed. Attacks are almost always recurrent and surgery is usually required for permanent treatment.  SEEK IMMEDIATE MEDICAL CARE IF:   Your pain increases and is not controlled by medicines.   You have a fever or persistent symptoms for more than 2-3 days.   You have a fever and your symptoms suddenly get worse.   You have persistent nausea and vomiting.  MAKE SURE YOU:   Understand these instructions.  Will watch your condition.  Will get help right away if you are not doing well or get worse. Document Released: 10/11/2005 Document Revised: 06/17/2013 Document Reviewed: 04/08/2013 [facility] Patient Information 2015 [place], [place]. This information is not intended to replace advice given to you by your health care provider. Make sure you discuss any questions you have with your health care provider.

## 2015-06-27 ENCOUNTER — Ambulatory Visit
Admission: RE | Admit: 2015-06-27 | Discharge: 2015-06-27 | Disposition: A | Discharge status: Home / Self Care | Payer: 59 | Source: Ambulatory Visit | Attending: Family Medicine | Admitting: Family Medicine

## 2015-06-27 ENCOUNTER — Other Ambulatory Visit: Payer: Self-pay | Admitting: Family Medicine

## 2015-06-27 DIAGNOSIS — Z8719 Personal history of other diseases of the digestive system: Secondary | ICD-10-CM

## 2015-06-27 DIAGNOSIS — R1011 Right upper quadrant pain: Secondary | ICD-10-CM

## 2015-06-27 DIAGNOSIS — K8020 Calculus of gallbladder without cholecystitis without obstruction: Secondary | ICD-10-CM | POA: Insufficient documentation

## 2015-06-27 DIAGNOSIS — K760 Fatty (change of) liver, not elsewhere classified: Secondary | ICD-10-CM | POA: Insufficient documentation

## 2015-07-12 ENCOUNTER — Ambulatory Visit (INDEPENDENT_AMBULATORY_CARE_PROVIDER_SITE_OTHER): Payer: 59 | Admitting: Internal Medicine

## 2015-07-12 VITALS — BP 100/72 | HR 80 | Temp 98.7°F | Resp 18 | Ht 63.0 in | Wt 145.0 lb

## 2015-07-12 DIAGNOSIS — K8020 Calculus of gallbladder without cholecystitis without obstruction: Secondary | ICD-10-CM

## 2015-07-12 DIAGNOSIS — J0100 Acute maxillary sinusitis, unspecified: Secondary | ICD-10-CM | POA: Diagnosis not present

## 2015-07-12 MED ORDER — AMOXICILLIN 875 MG PO TABS: 875.0000 mg | ORAL_TABLET | Freq: Two times a day (BID) | ORAL | Status: DC

## 2015-07-12 MED ORDER — FLUTICASONE PROPIONATE 50 MCG/ACT NA SUSP: NASAL | Status: DC

## 2015-07-12 NOTE — Progress Notes (Signed)
Subjective:  This chart was scribed for Ellamae Sia, MD by Stann Ore, Medical Scribe. This patient was seen in Room 13 and the patient's care was started at 7:49 PM.     Patient ID: Carly Gordon, female    DOB: Feb 13, 1986, 29 y.o.   MRN: [id number] Chief Complaint  Patient presents with  . Cough    since sunday   . Nausea  . Nasal Congestion  . Fever    off and on at night     HPI Carly Gordon is a 29 y.o. female who presents to [facility] complaining of general sickness that started 2 days ago.  She states that 2 days ago, she felt that her head felt heavy with some body aches. She also noted some intermittent fever at night where it would climb to Tmax 101. She denies vomit but gags and has the sensation like she is about to. She also has some rhinorrhea, nasal congestion and ear pain. She might have sick contact as she drove a friend with similar symptoms and he was diagnosed with pneumonia. She denies sleep disturbances.   Here 06/16/15 --GBUS revealed gallstones. She said she did not know the results and had not heard from [place]. She has not developed acute abdominal pain in the interim. Review of the chart shows that the surgery referral has not yet been made.  Patient Active Problem List   Diagnosis Date Noted  . Postpartum care following cesarean delivery (6/12) 04/09/2014  . Cesarean delivery delivered 04/09/2014    Current outpatient prescriptions:  .  acetaminophen (TYLENOL) 325 MG tablet, Take 650 mg by mouth every 6 (six) hours as needed., Disp: , Rfl:  .  clobetasol ointment (TEMOVATE) 0.05 %, Apply 1 application topically 2 (two) times daily., Disp: 30 g, Rfl: 1 .  ibuprofen (ADVIL,MOTRIN) 600 MG tablet, Take 1 tablet (600 mg total) by mouth every 6 (six) hours. (Patient not taking: Reported on 05/24/2015), Disp: 30 tablet, Rfl: 0   Review of Systems  Constitutional: Positive for fever and fatigue. Negative for chills and diaphoresis.    HENT: Positive for congestion, ear pain and rhinorrhea. Negative for sore throat.   Respiratory: Positive for cough.   Gastrointestinal: Positive for nausea. Negative for vomiting.  Musculoskeletal: Positive for myalgias (general body aches).  Psychiatric/Behavioral: Negative for sleep disturbance.       Objective:   Physical Exam  Constitutional: She is oriented to person, place, and time. She appears well-developed and well-nourished. No distress.  HENT:  Head: Normocephalic and atraumatic.  Right Ear: External ear normal.  Left Ear: External ear normal.  Mouth/Throat: Oropharynx is clear and moist.  Purulent nasal discharge  Eyes: Conjunctivae and EOM are normal. Pupils are equal, round, and reactive to light.  Neck: Neck supple.  Cardiovascular: Normal rate, regular rhythm and normal heart sounds.   Pulmonary/Chest: Effort normal and breath sounds normal. No respiratory distress.  Musculoskeletal: Normal range of motion.  Lymphadenopathy:    She has no cervical adenopathy.  Neurological: She is alert and oriented to person, place, and time.  Skin: Skin is warm and dry. No rash noted.  Psychiatric: She has a normal mood and affect. Her behavior is normal.  Nursing note and vitals reviewed.   BP 100/72 mmHg  Pulse 80  Temp(Src) 98.7 F (37.1 C) (Oral)  Resp 18  Ht 5\' 3"  (1.6 m)  Wt 145 lb (65.772 kg)  BMI 25.69 kg/m2  SpO2 98%  LMP 06/25/2015  Assessment & Plan:  Problem #1 acute sinusitis secondary to acute viral illness Sudafed if needed/Afrin before Flonase for 4 days if needed Meds ordered this encounter  Medications  . acetaminophen (TYLENOL) 325 MG tablet    Sig: Take 650 mg by mouth every 6 (six) hours as needed.  Marland Kitchen amoxicillin (AMOXIL) 875 MG tablet    Sig: Take 1 tablet (875 mg total) by mouth 2 (two) times daily.    Dispense:  20 tablet    Refill:  0  . fluticasone (FLONASE) 50 MCG/ACT nasal spray    Sig: 2 sprays each nostril once a day     Dispense:  16 g    Refill:  1   Problem #2 gallstones with intermittent abdominal distress Surgical referral established I have completed the patient encounter in its entirety as documented by the scribe, with editing by me where necessary. Robert P. Merla Riches, M.D.

## 2015-07-12 NOTE — Patient Instructions (Addendum)
In order to breathe more easily can't Sudafed 12 hour tablets twice a day for 5 days will have to ask the pharmacist's medicines which is Behind the counter. You also may use Afrin nasal spray at bedtime in order to sleep better -1 spray each nostril. Do not do that for more than 4 days. Tylenol should help headache.  EXAM: US ABDOMEN LIMITED - RIGHT UPPER QUADRANT  FINDINGS: Gallbladder:  Multiple shadowing gallstones lie within a mildly distended gallbladder. There is no wall thickening. There is no pericholecystic fluid.  Liver: Mild increased echogenicity and somewhat coarsened echotexture. Normal in overall size. No mass or focal lesion. Hepatopetal flow documented in the portal vein.  IMPRESSION: 1. Cholelithiasis with no evidence of acute cholecystitis. 2. No bile duct dilation. 3. Probable hepatic steatosis.

## 2015-07-25 ENCOUNTER — Other Ambulatory Visit: Payer: Self-pay | Admitting: Surgery

## 2015-09-18 IMAGING — US US ABDOMEN LIMITED
1 series · 14 of 25 positions shown · non-contrast
Comparison: None.

CLINICAL DATA: RUQ abdominal pain VUR.UU (FYZ-K6-CM)History of
gallstones AYQ.C5 (FYZ-K6-CM)

EXAM:
US ABDOMEN LIMITED - RIGHT UPPER QUADRANT

[Series 1: us abdomen limited · 0.16mm/px · 14 of 41 slices shown]
[im 1/41]
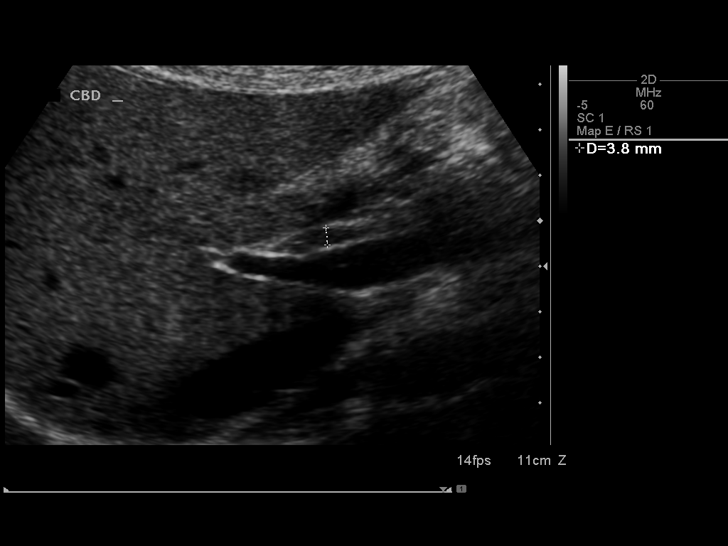
[im 4/41]
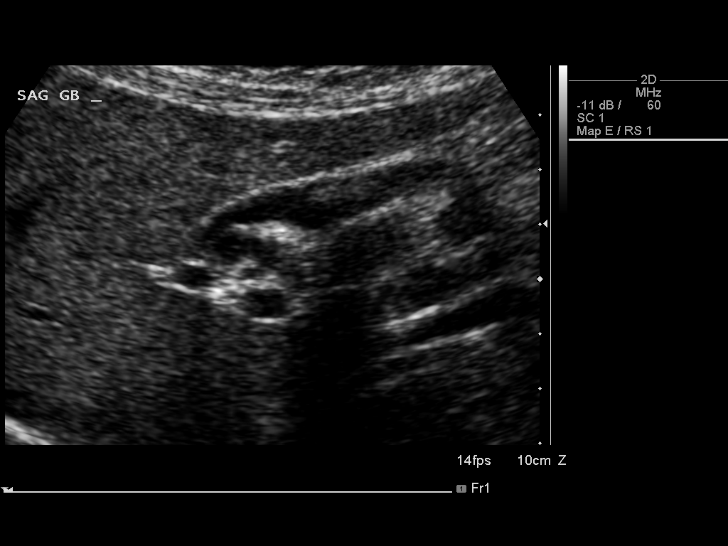
[im 7/41]
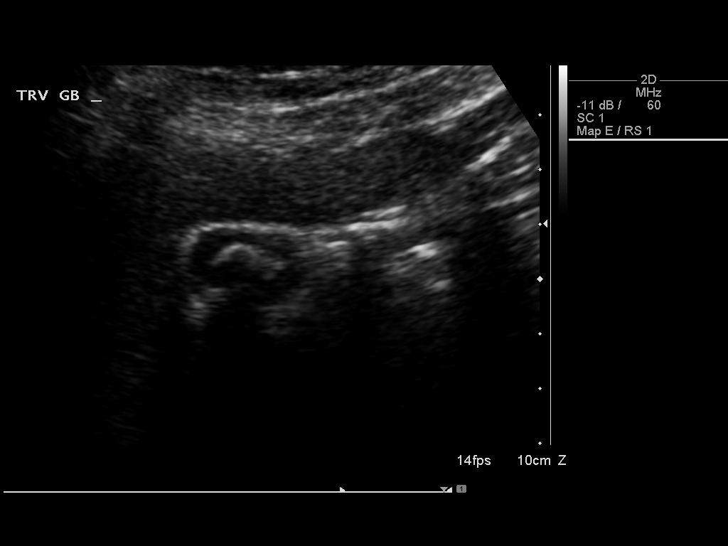
[im 11/41]
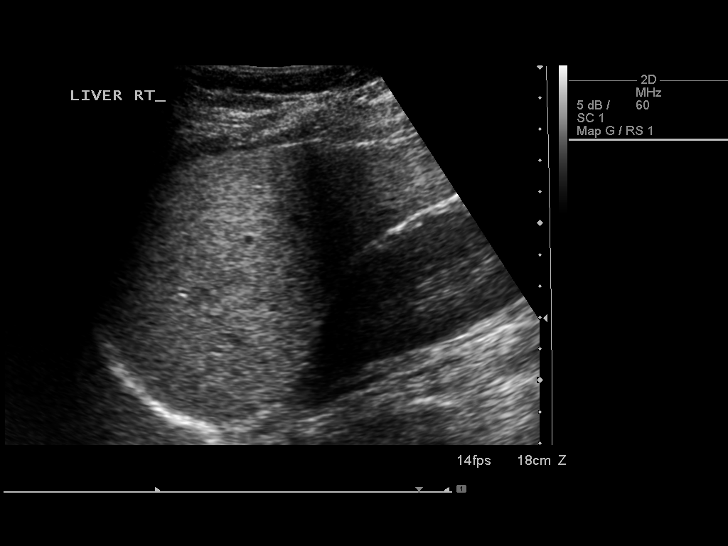
[im 14/41]
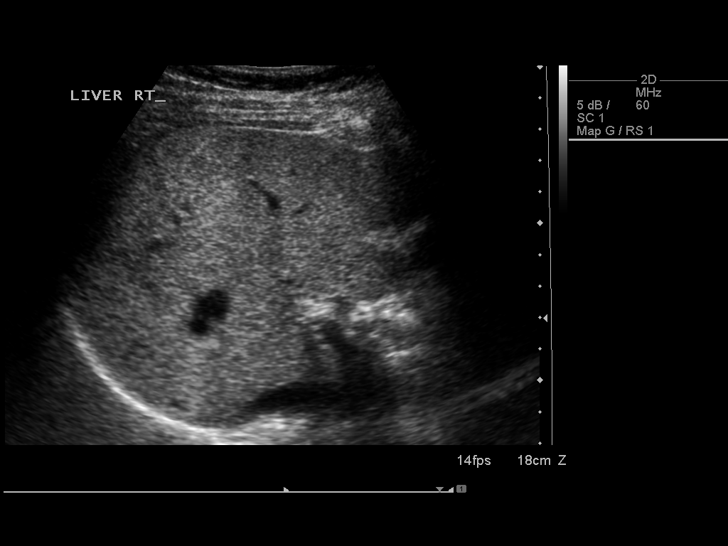
[im 16/41]
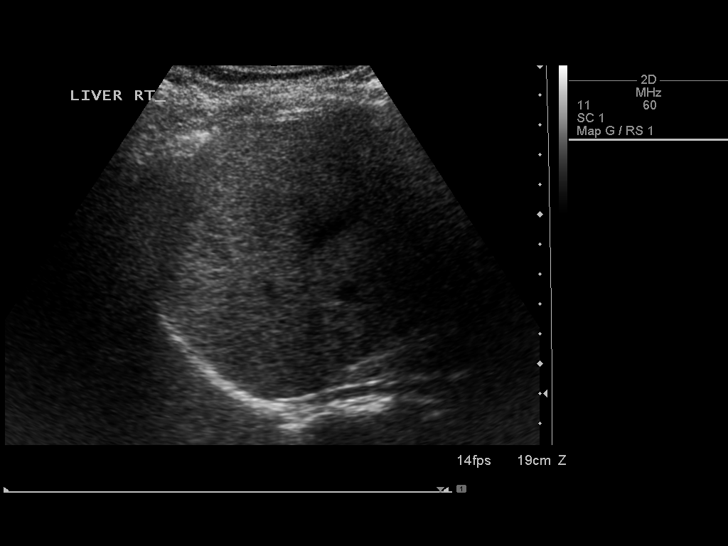
[im 19/41]
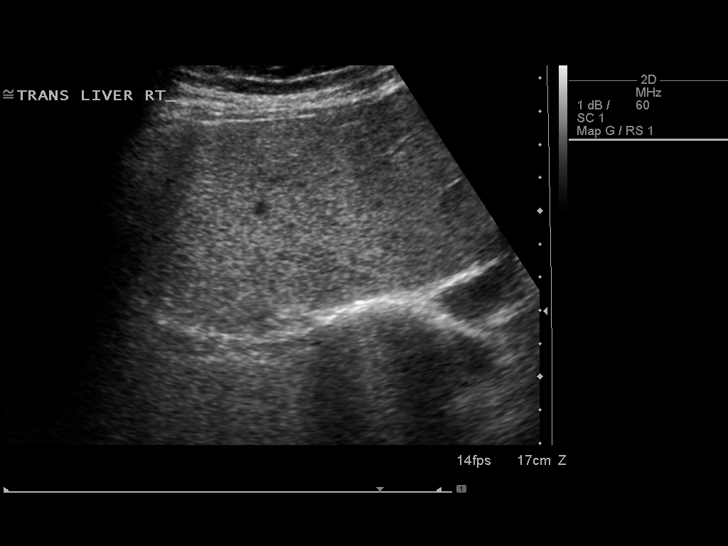
[im 22/41]
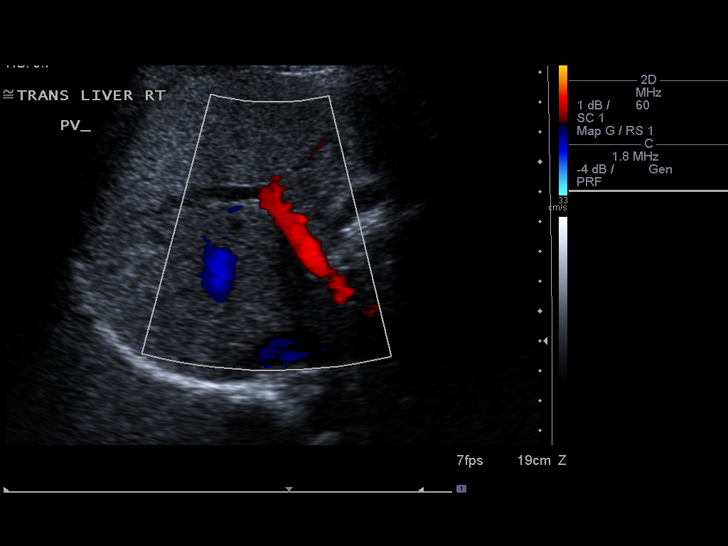
[im 26/41]
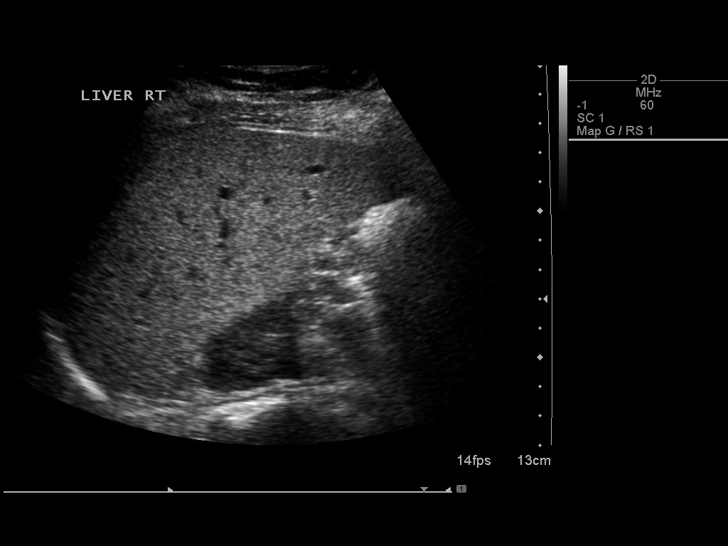
[im 27/41]
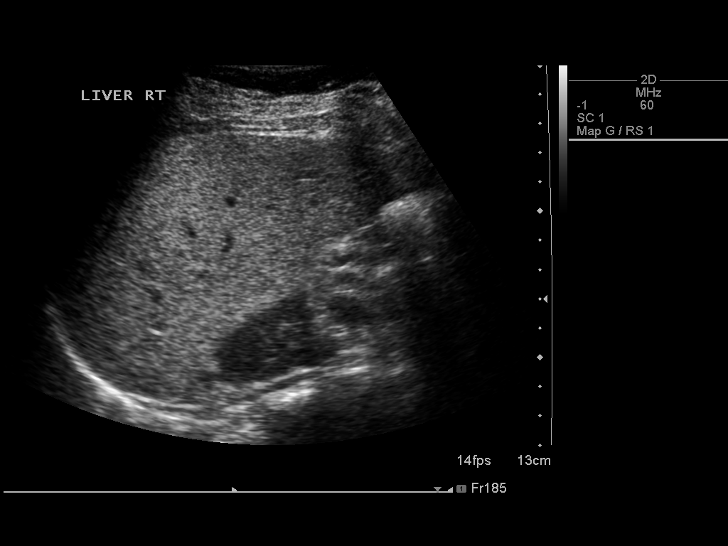
[im 31/41]
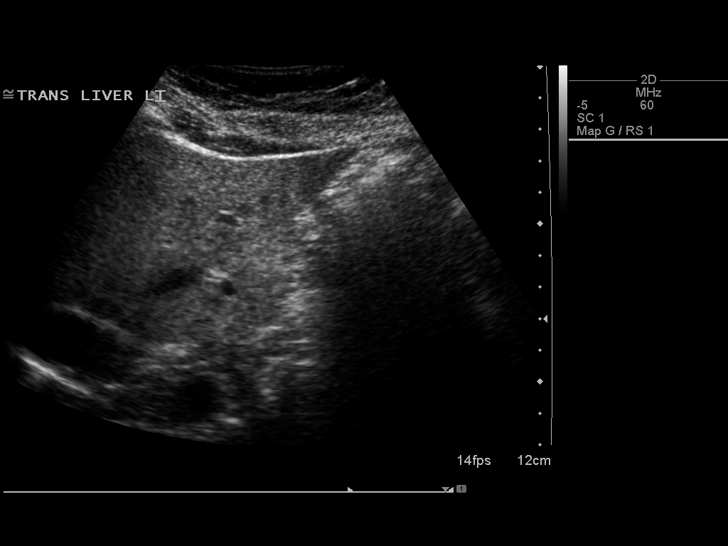
[im 34/41]
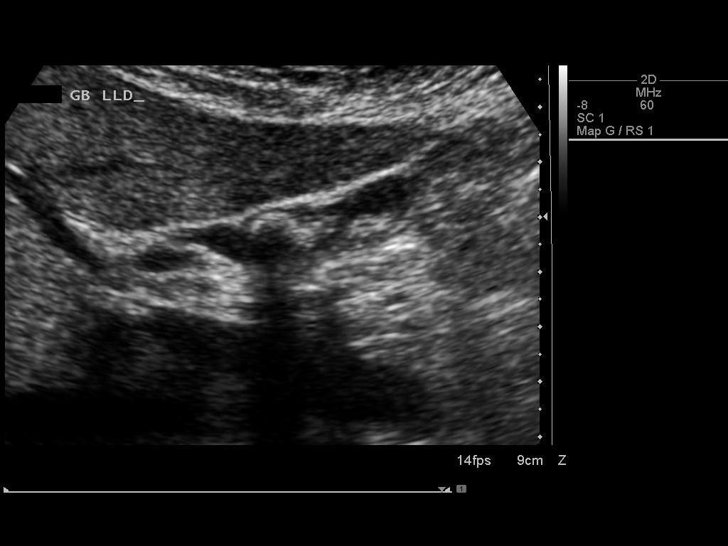
[im 37/41]
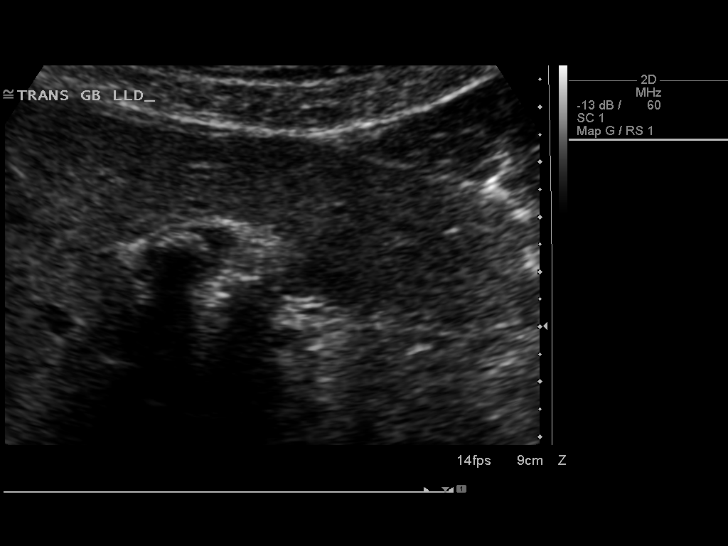
[im 41/41]
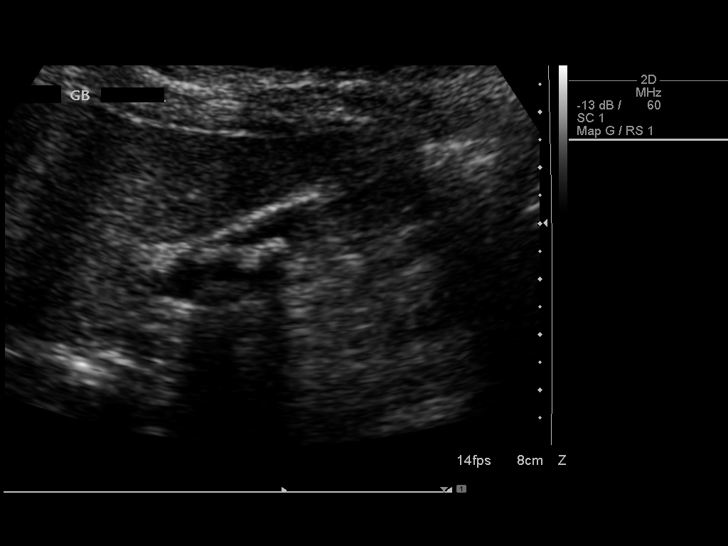

[14 of 25 positions shown; findings below may reference images not displayed]

FINDINGS: Gallbladder:

Multiple shadowing gallstones lie within a mildly distended
gallbladder. There is no wall thickening. There is no
pericholecystic fluid. No sonographic Murphy's sign.

Common bile duct:

Diameter: 3.8 mm

Liver:

Mild increased echogenicity and somewhat coarsened echotexture.
Normal in overall size. No mass or focal lesion. Hepatopetal flow
documented in the portal vein.
IMPRESSION: 1. Cholelithiasis with no evidence of acute cholecystitis.
2. No bile duct dilation.
3. Probable hepatic steatosis.

## 2016-01-30 ENCOUNTER — Ambulatory Visit (INDEPENDENT_AMBULATORY_CARE_PROVIDER_SITE_OTHER): Payer: 59 | Admitting: Family Medicine

## 2016-01-30 VITALS — BP 112/76 | HR 121 | Temp 103.2°F | Resp 16 | Ht 62.0 in | Wt 140.0 lb

## 2016-01-30 DIAGNOSIS — R519 Headache, unspecified: Secondary | ICD-10-CM

## 2016-01-30 DIAGNOSIS — J029 Acute pharyngitis, unspecified: Secondary | ICD-10-CM

## 2016-01-30 DIAGNOSIS — J020 Streptococcal pharyngitis: Secondary | ICD-10-CM

## 2016-01-30 DIAGNOSIS — R509 Fever, unspecified: Secondary | ICD-10-CM | POA: Diagnosis not present

## 2016-01-30 DIAGNOSIS — R51 Headache: Secondary | ICD-10-CM | POA: Diagnosis not present

## 2016-01-30 LAB — POCT RAPID STREP A (OFFICE): RAPID STREP A SCREEN: POSITIVE — AB

## 2016-01-30 MED ORDER — IBUPROFEN 200 MG PO TABS
600.0000 mg | ORAL_TABLET | Freq: Once | ORAL | Status: AC
  Administered 2016-01-30: 600 mg via ORAL

## 2016-01-30 MED ORDER — PENICILLIN V POTASSIUM 500 MG PO TABS: 500.0000 mg | ORAL_TABLET | Freq: Three times a day (TID) | ORAL | Status: DC

## 2016-01-30 NOTE — Progress Notes (Signed)
Subjective:    Patient ID: Carly Gordon, female    DOB: [birth year], 30 y.o.   MRN: [id number]  HPI This is a pleasant 30 yo female who presents today with 1 day of headache, fever/chills, dizziness, sore throat.  She has a 2 and 29 yo at home. No one else with fever. Kids with cough two days ago. Symptoms with sudden onset.       Past Medical History  Diagnosis Date  . Anemia   . Psoriasis     legs  . Postpartum care following cesarean delivery (6/12) 04/09/2014   Past Surgical History  Procedure Laterality Date  . Cesarean section  02/2012    x 1 [place]  . Cesarean section N/A 04/09/2014    Procedure: Repeat CESAREAN SECTION;  Surgeon: Robley Fries, MD;  Location: [facility];  Service: Obstetrics;  Laterality: N/A;  EDD: 04/06/14   Family History  Problem Relation Age of Onset  . Diabetes Mother   . Diabetes Father    Social History  Substance Use Topics  . Smoking status: Never Smoker   . Smokeless tobacco: Never Used  . Alcohol Use: No      Review of Systems  Constitutional: Positive for fever, chills and fatigue.  HENT: Positive for sore throat. Negative for congestion and rhinorrhea.   Respiratory: Negative for shortness of breath and wheezing.   Gastrointestinal: Negative for nausea, vomiting and abdominal pain.  Neurological: Positive for headaches.       Objective:   Physical Exam  Constitutional: She is oriented to person, place, and time. She appears well-developed and well-nourished. She appears ill. No distress.  HENT:  Head: Normocephalic and atraumatic.  Right Ear: External ear and ear canal normal. Tympanic membrane is scarred.  Left Ear: External ear and ear canal normal. Tympanic membrane is scarred.  Nose: Mucosal edema present.  Mouth/Throat: Uvula is midline. Posterior oropharyngeal erythema present. No oropharyngeal exudate or posterior oropharyngeal edema.  Cardiovascular: Regular rhythm and normal heart sounds.  Tachycardia present.    Pulmonary/Chest: Effort normal and breath sounds normal.  Musculoskeletal: Normal range of motion.  Neurological: She is alert and oriented to person, place, and time.  Skin: Skin is warm and dry. She is not diaphoretic.  Psychiatric: She has a normal mood and affect. Her behavior is normal. Judgment and thought content normal.  Vitals reviewed.  BP 112/76 mmHg  Pulse 121  Temp(Src) 103.2 F (39.6 C) (Oral)  Resp 16  Ht  (1.575 m)  Wt 140 lb (63.504 kg)  BMI 25.60 kg/m2  SpO2 96%  LMP 01/19/2016 (Exact Date)  Breastfeeding? No  Results for orders placed or performed in visit on 01/30/16  POCT rapid strep A  Result Value Ref Range   Rapid Strep A Screen Positive (A) Negative   Patient given ibuprofen 600 mg po when roomed. HR reduced to 104 and temp to 99.8, she was feeling a little better.     Assessment & Plan:  1. Fever, unspecified - ibuprofen (ADVIL,MOTRIN) tablet 600 mg; Take 3 tablets (600 mg total) by mouth once. - POCT rapid strep A - improved following ibuprofen  2. Headache, unspecified headache type - ibuprofen (ADVIL,MOTRIN) tablet 600 mg; Take 3 tablets (600 mg total) by mouth once.  3. Sore throat - POCT rapid strep A  4. Strep pharyngitis - penicillin v potassium (VEETID) 500 MG tablet; Take 1 tablet (500 mg total) by mouth 3 (three) times daily.  Dispense: 30 tablet; Refill: 0  -  suspect viral illness (highly suspicious for influenza) along with strep pharyngitis, provided written and verbal instructions regarding increasing fluid intake, symptomatic treatment and RTC precautions.   Olean Reeeborah Gessner, FNP-BC  [facility]  01/30/2016 1:33 PM

## 2016-01-30 NOTE — Patient Instructions (Addendum)
I think you have the flu and you tested positive for strep throat I have sent your prescription for an antibiotic to your pharmacy, Please finish it all  If you have side effects from the antibiotic, please call the office before stopping  For muscle aches, headache, sore throat you can take over the counter acetaminophen or ibuprofen as directed on the package For nasal congestion you can use Afrin nasal spray twice a day for up to 3 days, and /or sudafed, and/or saline nasal spray For cough you can use Delsym cough syrup Please come back to see [place] or go to the emergency department if you are not better in 5 to 7 days or if you develop fever over 101 for more than 48 hours or if you develop wheezing or shortness of breath.      IF you received an x-ray today, you will receive an invoice from [facility]. Please contact [facility] at [phone] with questions or concerns regarding your invoice.   IF you received labwork today, you will receive an invoice from [employer]. Please contact [facility] at [phone] with questions or concerns regarding your invoice.   Our billing staff will not be able to assist you with questions regarding bills from these companies.  You will be contacted with the lab results as soon as they are available. The fastest way to get your results is to activate your My Chart account. Instructions are located on the last page of this paperwork. If you have not heard from [place] regarding the results in 2 weeks, please contact this office.    Strep Throat Strep throat is a bacterial infection of the throat. Your health care provider may call the infection tonsillitis or pharyngitis, depending on whether there is swelling in the tonsils or at the back of the throat. Strep throat is most common during the cold months of the year in children who are 71-57 years of age, but it can happen during any season in people of any age. This  infection is spread from person to person (contagious) through coughing, sneezing, or close contact. CAUSES Strep throat is caused by the bacteria called Streptococcus pyogenes. RISK FACTORS This condition is more likely to develop in:  People who spend time in crowded places where the infection can spread easily.  People who have close contact with someone who has strep throat. SYMPTOMS Symptoms of this condition include:  Fever or chills.   Redness, swelling, or pain in the tonsils or throat.  Pain or difficulty when swallowing.  White or yellow spots on the tonsils or throat.  Swollen, tender glands in the neck or under the jaw.  Red rash all over the body (rare). DIAGNOSIS This condition is diagnosed by performing a rapid strep test or by taking a swab of your throat (throat culture test). Results from a rapid strep test are usually ready in a few minutes, but throat culture test results are available after one or two days. TREATMENT This condition is treated with antibiotic medicine. HOME CARE INSTRUCTIONS Medicines  Take over-the-counter and prescription medicines only as told by your health care provider.  Take your antibiotic as told by your health care provider. Do not stop taking the antibiotic even if you start to feel better.  Have family members who also have a sore throat or fever tested for strep throat. They may need antibiotics if they have the strep infection. Eating and Drinking  Do not share food, drinking cups,  or personal items that could cause the infection to spread to other people.  If swallowing is difficult, try eating soft foods until your sore throat feels better.  Drink enough fluid to keep your urine clear or pale yellow. General Instructions  Gargle with a salt-water mixture 3-4 times per day or as needed. To make a salt-water mixture, completely dissolve -1 tsp of salt in 1 cup of warm water.  Make sure that all household members wash  their hands well.  Get plenty of rest.  Stay home from school or work until you have been taking antibiotics for 24 hours.  Keep all follow-up visits as told by your health care provider. This is important. SEEK MEDICAL CARE IF:  The glands in your neck continue to get bigger.  You develop a rash, cough, or earache.  You cough up a thick liquid that is green, yellow-brown, or bloody.  You have pain or discomfort that does not get better with medicine.  Your problems seem to be getting worse rather than better.  You have a fever. SEEK IMMEDIATE MEDICAL CARE IF:  You have new symptoms, such as vomiting, severe headache, stiff or painful neck, chest pain, or shortness of breath.  You have severe throat pain, drooling, or changes in your voice.  You have swelling of the neck, or the skin on the neck becomes red and tender.  You have signs of dehydration, such as fatigue, dry mouth, and decreased urination.  You become increasingly sleepy, or you cannot wake up completely.  Your joints become red or painful.   This information is not intended to replace advice given to you by your health care provider. Make sure you discuss any questions you have with your health care provider.   Document Released: 10/12/2000 Document Revised: 07/06/2015 Document Reviewed: 02/07/2015 Elsevier Interactive Patient Education [employer]2016 [employer]vier Inc.

## 2016-06-06 ENCOUNTER — Other Ambulatory Visit: Payer: Self-pay | Admitting: Family Medicine

## 2016-06-06 DIAGNOSIS — R21 Rash and other nonspecific skin eruption: Secondary | ICD-10-CM

## 2016-07-27 ENCOUNTER — Ambulatory Visit (INDEPENDENT_AMBULATORY_CARE_PROVIDER_SITE_OTHER): Payer: 59 | Admitting: Family Medicine

## 2016-07-27 VITALS — BP 118/76 | HR 80 | Temp 98.5°F | Resp 16 | Ht 62.0 in | Wt 133.0 lb

## 2016-07-27 DIAGNOSIS — S91331A Puncture wound without foreign body, right foot, initial encounter: Secondary | ICD-10-CM | POA: Diagnosis not present

## 2016-07-27 MED ORDER — CLOBETASOL PROPIONATE 0.05 % EX OINT: 1.0000 "application " | TOPICAL_OINTMENT | Freq: Two times a day (BID) | CUTANEOUS | 0 refills | Status: DC

## 2016-07-27 MED ORDER — IBUPROFEN 200 MG PO TABS
600.0000 mg | ORAL_TABLET | Freq: Once | ORAL | Status: AC
  Administered 2016-07-27: 600 mg via ORAL

## 2016-07-27 NOTE — Progress Notes (Signed)
   Patient ID: Carly Gordon, female    DOB: [birth year], 30 y.o.   MRN: [id number]  PCP: No PCP Per Patient  Chief Complaint  Patient presents with  . Foot Injury    right, x today     Subjective:   HPI 30 year old female, presents for evaluation of a puncture wound unintentionally self-inflicted with her foot scrubbing brush which broke in half while she was scrubbing her feet. She scratched the right outer lateral of foot and suffered a circular puncture wound in medial lower right foot. The patient reports lose of large amount of blood from the puncture wound when the injury originally occurred. She describes a burning pain localized to site of injury.   Social History   Social History  . Marital status: Married    Spouse name: N/A  . Number of children: N/A  . Years of education: N/A   Occupational History  . Not on file.   Social History Main Topics  . Smoking status: Never Smoker  . Smokeless tobacco: Never Used  . Alcohol use No  . Drug use: No  . Sexual activity: Yes    Birth control/ protection: None     Comment: pregnant   Other Topics Concern  . Not on file   Social History Narrative  . No narrative on file   Family History  Problem Relation Age of Onset  . Diabetes Mother   . Diabetes Father    Review of Systems See HPI  Patient Active Problem List   Diagnosis Date Noted  . Postpartum care following cesarean delivery (6/12) 04/09/2014  . Cesarean delivery delivered 04/09/2014     Prior to Admission medications   Not on File   No Known Allergies     Objective:  Physical Exam  Constitutional: She is oriented to person, place, and time. She appears well-developed and well-nourished.  HENT:  Head: Normocephalic and atraumatic.  Right Ear: External ear normal.  Left Ear: External ear normal.  Nose: Nose normal.  Eyes: Conjunctivae and EOM are normal. Pupils are equal, round, and reactive to light.  Neck: Normal range of  motion.  Cardiovascular: Normal rate.   Pulmonary/Chest: Effort normal and breath sounds normal.  Musculoskeletal: Normal range of motion.  Neurological: She is alert and oriented to person, place, and time.  Skin: Skin is warm. There is erythema.  5 mm puncture, shallow, non-deep, non-tunneling wound, in lower medial posterior aspect of foot.  Psychiatric: She has a normal mood and affect. Her behavior is normal. Judgment and thought content normal.   Vitals:   07/27/16 1541  BP: 118/76  Pulse: 80  Resp: 16  Temp: 98.5 F (36.9 C)     Assessment & Plan:  1. Puncture wound of foot, right, initial encounter, stable, uncomplicated wound that will likely heal by secondary intention as there is insufficient tissue for suturing.   Plan: - Ibuprofen (ADVIL,MOTRIN) tablet 600 mg; Take 3 tablets (600 mg total) by mouth once for pain. -irrigate wound with soap and water, apply mupirocin ointment, and apply dressing.  Wound care instructions provided.  Godfrey PickKimberly S. Tiburcio PeaHarris, MSN, FNP-C [facility]

## 2016-07-27 NOTE — Patient Instructions (Signed)
WOUND CARE . Keep area clean and dry for 24 hours. Do not remove bandage, if applied. . After 24 hours, remove bandage and wash wound gently with mild soap and warm water. Reapply a new bandage after cleaning wound, if directed. . Continue daily cleansing with soap and water until stitches/staples are removed. .. Notify the office if you experience any of the following signs of infection: Swelling, redness, pus drainage, streaking, fever >101.0 F . Notify the office if you experience excessive bleeding that does not stop after 15-20 minutes of constant, firm pressure.  Okay to take over the counter acetaminophen or ibuprofen for pain.    IF you received an x-ray today, you will receive an invoice from [facility]. Please contact [facility] at [phone] with questions or concerns regarding your invoice.   IF you received labwork today, you will receive an invoice from [facility]. Please contact [facility] at [phone] with questions or concerns regarding your invoice.   Our billing staff will not be able to assist you with questions regarding bills from these companies.  You will be contacted with the lab results as soon as they are available. The fastest way to get your results is to activate your My Chart account. Instructions are located on the last page of this paperwork. If you have not heard from us regarding the results in 2 weeks, please contact this office.

## 2016-08-31 ENCOUNTER — Ambulatory Visit (INDEPENDENT_AMBULATORY_CARE_PROVIDER_SITE_OTHER): Payer: 59 | Admitting: Family Medicine

## 2016-08-31 VITALS — BP 96/66 | HR 82 | Temp 98.5°F | Resp 17 | Ht 62.0 in | Wt 133.0 lb

## 2016-08-31 DIAGNOSIS — R1032 Left lower quadrant pain: Secondary | ICD-10-CM

## 2016-08-31 DIAGNOSIS — N949 Unspecified condition associated with female genital organs and menstrual cycle: Secondary | ICD-10-CM

## 2016-08-31 DIAGNOSIS — N760 Acute vaginitis: Secondary | ICD-10-CM

## 2016-08-31 DIAGNOSIS — B9689 Other specified bacterial agents as the cause of diseases classified elsewhere: Secondary | ICD-10-CM | POA: Diagnosis not present

## 2016-08-31 LAB — POC MICROSCOPIC URINALYSIS (UMFC): MUCUS RE: ABSENT

## 2016-08-31 LAB — POCT URINALYSIS DIP (MANUAL ENTRY)
BILIRUBIN UA: NEGATIVE
Bilirubin, UA: NEGATIVE
Blood, UA: NEGATIVE
GLUCOSE UA: NEGATIVE
Leukocytes, UA: NEGATIVE
Nitrite, UA: NEGATIVE
Protein Ur, POC: NEGATIVE
SPEC GRAV UA: 1.02
Urobilinogen, UA: 0.2
pH, UA: 5.5

## 2016-08-31 LAB — POCT CBC
GRANULOCYTE PERCENT: 63.2 % (ref 37–80)
HEMATOCRIT: 37.7 % (ref 37.7–47.9)
Hemoglobin: 13.2 g/dL (ref 12.2–16.2)
Lymph, poc: 2.8 (ref 0.6–3.4)
MCH, POC: 26.8 pg — AB (ref 27–31.2)
MCHC: 34.9 g/dL (ref 31.8–35.4)
MCV: 76.8 fL — AB (ref 80–97)
MID (CBC): 0.6 (ref 0–0.9)
MPV: 7.8 fL (ref 0–99.8)
POC GRANULOCYTE: 5.8 (ref 2–6.9)
POC LYMPH %: 29.9 % (ref 10–50)
POC MID %: 6.9 % (ref 0–12)
Platelet Count, POC: 269 10*3/uL (ref 142–424)
RBC: 4.92 M/uL (ref 4.04–5.48)
RDW, POC: 13.7 %
WBC: 9.2 10*3/uL (ref 4.6–10.2)

## 2016-08-31 LAB — POC HEMOCCULT BLD/STL (OFFICE/1-CARD/DIAGNOSTIC): FECAL OCCULT BLD: NEGATIVE

## 2016-08-31 LAB — POCT WET + KOH PREP
Trich by wet prep: ABSENT
YEAST BY KOH: ABSENT
YEAST BY WET PREP: ABSENT

## 2016-08-31 LAB — POCT URINE PREGNANCY: Preg Test, Ur: NEGATIVE

## 2016-08-31 MED ORDER — METRONIDAZOLE 500 MG PO TABS: 500.0000 mg | ORAL_TABLET | Freq: Two times a day (BID) | ORAL | 0 refills | Status: DC

## 2016-08-31 NOTE — Patient Instructions (Addendum)
IF you received an x-ray today, you will receive an invoice from [facility]. Please contact [facility] at [phone] with questions or concerns regarding your invoice.   IF you received labwork today, you will receive an invoice from [facility]. Please contact [facility] at [phone] with questions or concerns regarding your invoice.   Our billing staff will not be able to assist you with questions regarding bills from these companies.  You will be contacted with the lab results as soon as they are available. The fastest way to get your results is to activate your My Chart account. Instructions are located on the last page of this paperwork. If you have not heard from us regarding the results in 2 weeks, please contact this office.     Ovarian Cyst An ovarian cyst is a fluid-filled sac that forms on an ovary. The ovaries are small organs that produce eggs in women. Various types of cysts can form on the ovaries. Most are not cancerous. Many do not cause problems, and they often go away on their own. Some may cause symptoms and require treatment. Common types of ovarian cysts include:  Functional cysts--These cysts may occur every month during the menstrual cycle. This is normal. The cysts usually go away with the next menstrual cycle if the woman does not get pregnant. Usually, there are no symptoms with a functional cyst.  Endometrioma cysts--These cysts form from the tissue that lines the uterus. They are also called "chocolate cysts" because they become filled with blood that turns brown. This type of cyst can cause pain in the lower abdomen during intercourse and with your menstrual period.  Cystadenoma cysts--This type develops from the cells on the outside of the ovary. These cysts can get very big and cause lower abdomen pain and pain with intercourse. This type of cyst can twist on itself, cut off its blood supply, and cause severe  pain. It can also easily rupture and cause a lot of pain.  Dermoid cysts--This type of cyst is sometimes found in both ovaries. These cysts may contain different kinds of body tissue, such as skin, teeth, hair, or cartilage. They usually do not cause symptoms unless they get very big.  Theca lutein cysts--These cysts occur when too much of a certain hormone (human chorionic gonadotropin) is produced and overstimulates the ovaries to produce an egg. This is most common after procedures used to assist with the conception of a baby (in vitro fertilization). CAUSES   Fertility drugs can cause a condition in which multiple large cysts are formed on the ovaries. This is called ovarian hyperstimulation syndrome.  A condition called polycystic ovary syndrome can cause hormonal imbalances that can lead to nonfunctional ovarian cysts. SIGNS AND SYMPTOMS  Many ovarian cysts do not cause symptoms. If symptoms are present, they may include:  Pelvic pain or pressure.  Pain in the lower abdomen.  Pain during sexual intercourse.  Increasing girth (swelling) of the abdomen.  Abnormal menstrual periods.  Increasing pain with menstrual periods.  Stopping having menstrual periods without being pregnant. DIAGNOSIS  These cysts are commonly found during a routine or annual pelvic exam. Tests may be ordered to find out more about the cyst. These tests may include:  Ultrasound.  X-ray of the pelvis.  CT scan.  MRI.  Blood tests. TREATMENT  Many ovarian cysts go away on their own without treatment. Your health care provider may want to check your cyst regularly for 2-3 months  to see if it changes. For women in menopause, it is particularly important to monitor a cyst closely because of the higher rate of ovarian cancer in menopausal women. When treatment is needed, it may include any of the following:  A procedure to drain the cyst (aspiration). This may be done using a long needle and ultrasound. It  can also be done through a laparoscopic procedure. This involves using a thin, lighted tube with a tiny camera on the end (laparoscope) inserted through a small incision.  Surgery to remove the whole cyst. This may be done using laparoscopic surgery or an open surgery involving a larger incision in the lower abdomen.  Hormone treatment or birth control pills. These methods are sometimes used to help dissolve a cyst. HOME CARE INSTRUCTIONS   Only take over-the-counter or prescription medicines as directed by your health care provider.  Follow up with your health care provider as directed.  Get regular pelvic exams and Pap tests. SEEK MEDICAL CARE IF:   Your periods are late, irregular, or painful, or they stop.  Your pelvic pain or abdominal pain does not go away.  Your abdomen becomes larger or swollen.  You have pressure on your bladder or trouble emptying your bladder completely.  You have pain during sexual intercourse.  You have feelings of fullness, pressure, or discomfort in your stomach.  You lose weight for no apparent reason.  You feel generally ill.  You become constipated.  You lose your appetite.  You develop acne.  You have an increase in body and facial hair.  You are gaining weight, without changing your exercise and eating habits.  You think you are pregnant. SEEK IMMEDIATE MEDICAL CARE IF:   You have increasing abdominal pain.  You feel sick to your stomach (nauseous), and you throw up (vomit).  You develop a fever that comes on suddenly.  You have abdominal pain during a bowel movement.  Your menstrual periods become heavier than usual. MAKE SURE YOU:  Understand these instructions.  Will watch your condition.  Will get help right away if you are not doing well or get worse.   This information is not intended to replace advice given to you by your health care provider. Make sure you discuss any questions you have with your health care  provider.   Document Released: 10/15/2005 Document Revised: 10/20/2013 Document Reviewed: 06/22/2013 Elsevier Interactive Patient Education 2016 [employer].  Endometritis Endometritis is an irritation, soreness, and swelling (inflammation) of the lining of the uterus (endometrium).  CAUSES   Bacterial infections.  Sexually transmitted infections (STIs).  Having a miscarriage or childbirth, especially after a long labor or cesarean delivery.  Certain gynecological procedures (such as dilation and curettage, hysteroscopy, or contraceptive insertion). SIGNS AND SYMPTOMS   Fever.  Lower abdominal or pelvic pain.  Abnormal vaginal discharge or bleeding.  Abdominal bloating (distention) or swelling.  General discomfort or ill feeling.  Discomfort with bowel movements. DIAGNOSIS  A physical and pelvic exam are performed. Other tests may include:  Cultures from the cervix.  Blood tests.  Examining a tissue sample of the uterine lining (endometrial biopsy).  Examining discharge under a microscope (wet prep).  Laparoscopy. TREATMENT  Antibiotic medicines are usually given. Other treatments may include:  Fluids through an IV tube inserted in your vein.  Rest. HOME CARE INSTRUCTIONS   Take over-the-counter or prescription medicines for pain, discomfort, or fever as directed by your health care provider.  Take your antibiotics as directed. Finish them  even if you start to feel better.  Resume your normal diet and activities as directed or as tolerated.  Do not douche or have sexual intercourse until your health care provider says it is okay.  Do not have sexual intercourse until your partner has been treated if your endometritis is caused by an STI. SEEK IMMEDIATE MEDICAL CARE IF:   You have swelling or increasing pain in the abdomen.  You have a fever.  You have bad smelling vaginal discharge, or you have an increased amount of discharge.  You have abnormal  vaginal bleeding.  Your medicine is not helping with the pain.  You experience any problems that may be related to the medicine you are taking.  You have nausea and vomiting, or you cannot keep foods down.  You have pain with bowel movements. MAKE SURE YOU:   Understand these instructions.  Will watch your condition.  Will get help right away if you are not doing well or get worse.   This information is not intended to replace advice given to you by your health care provider. Make sure you discuss any questions you have with your health care provider.   Document Released: 10/09/2001 Document Revised: 06/17/2013 Document Reviewed: 05/14/2013 Elsevier Interactive Patient Education [employer]2016 Elsevier Inc.

## 2016-08-31 NOTE — Progress Notes (Addendum)
Subjective:  By signing my name below, I, Stann Oresung-Kai Tsai, attest that this documentation has been prepared under the direction and in the presence of Norberto SorensonEva Shaw, MD. Electronically Signed: Stann Oresung-Kai Tsai, Scribe. 08/31/2016 , 4:35 PM .  Patient was seen in Room 3 .   Patient ID: Carly Gordon, female    DOB: 28-Aug-1986, 30 y.o.   MRN: [id number] Chief Complaint  Patient presents with  . Abdominal Pain    Onset 3 days   HPI Carly Gordon is a 30 y.o. female who [facility] complaining of LLQ abdominal pain that started 3 days ago. Patient states she's been having LLQ abdominal discomfort. She describes the pain isn't terrible, but the cramping is always present. She denies anything to worsen or improve the pain. She hasn't tried any medications for relief. She denies constipation (usually has 2 bowel movements daily), urinary symptoms, appetite change, or any other bowel symptoms.   She has history of C-section 2 years prior; G2P2. Her periods have been normal. She has her tubes tied.  She was diagnosed with gall stones last year; ultrasound confirmed gall stones and hepatic steatosis.   Past Medical History:  Diagnosis Date  . Anemia   . Postpartum care following cesarean delivery (6/12) 04/09/2014  . Psoriasis    legs   Prior to Admission medications   Medication Sig Start Date End Date Taking? Authorizing Provider  clobetasol ointment (TEMOVATE) 0.05 % Apply 1 application topically 2 (two) times daily. 07/27/16  Yes Doyle AskewKimberly Stephenia Harris, FNP   No Known Allergies   Review of Systems  Constitutional: Negative for appetite change, chills, fatigue, fever and unexpected weight change.  Respiratory: Negative for cough.   Gastrointestinal: Positive for abdominal pain. Negative for constipation, diarrhea, nausea and vomiting.  Genitourinary: Negative for dysuria, frequency and hematuria.  Skin: Negative for rash and wound.  Neurological: Negative for  dizziness, weakness and headaches.       Objective:   Physical Exam  Constitutional: She is oriented to person, place, and time. She appears well-developed and well-nourished. No distress.  HENT:  Head: Normocephalic and atraumatic.  Eyes: EOM are normal. Pupils are equal, round, and reactive to light.  Neck: Neck supple.  Cardiovascular: Normal rate.   Pulmonary/Chest: Effort normal. No respiratory distress.  Abdominal: Soft. Bowel sounds are normal. She exhibits no distension. There is no hepatosplenomegaly. There is no rebound and no guarding.  LLQ pain with referred pain from RLQ  Genitourinary:  Genitourinary Comments: Normal labia, speculum exam not performed, no vaginal discharge, positive CMT and uterine tenderness, uterus not enlarged and was mobile, no adnexal tenderness or mass  Musculoskeletal: Normal range of motion.  Neurological: She is alert and oriented to person, place, and time.  Skin: Skin is warm and dry.  Psychiatric: She has a normal mood and affect. Her behavior is normal.  Nursing note and vitals reviewed.   BP 96/66 (BP Location: Right Arm, Patient Position: Sitting, Cuff Size: Normal)   Pulse 82   Temp 98.5 F (36.9 C) (Oral)   Resp 17   Ht 5\' 2"  (1.575 m)   Wt 133 lb (60.3 kg)   LMP 08/19/2016   SpO2 99%   BMI 24.33 kg/m    Results for orders placed or performed in visit on 08/31/16  POCT urinalysis dipstick  Result Value Ref Range   Color, UA yellow yellow   Clarity, UA clear clear   Glucose, UA negative negative   Bilirubin, UA negative negative  Ketones, POC UA negative negative   Spec Grav, UA 1.020    Blood, UA negative negative   pH, UA 5.5    Protein Ur, POC negative negative   Urobilinogen, UA 0.2    Nitrite, UA Negative Negative   Leukocytes, UA Negative Negative  POCT urine pregnancy  Result Value Ref Range   Preg Test, Ur Negative Negative  POCT Microscopic Urinalysis ([facility])  Result Value Ref Range   WBC,UR,HPF,POC None  None WBC/hpf   RBC,UR,HPF,POC None None RBC/hpf   Bacteria None None, Too numerous to count   Mucus Absent Absent   Epithelial Cells, UR Per Microscopy None None, Too numerous to count cells/hpf  POCT Wet + KOH Prep  Result Value Ref Range   Yeast by KOH Absent Present, Absent   Yeast by wet prep Absent Present, Absent   WBC by wet prep Few None, Few, Too numerous to count   Clue Cells Wet Prep HPF POC Few (A) None, Too numerous to count   Trich by wet prep Absent Present, Absent   Bacteria Wet Prep HPF POC Few None, Few, Too numerous to count   Epithelial Cells By [employer] Pref ([facility]) Moderate (A) None, Few, Too numerous to count   RBC,UR,HPF,POC None None RBC/hpf  POC Hemoccult Bld/Stl (1-Cd Office Dx)  Result Value Ref Range   Card #1 Date 08/31/16    Fecal Occult Blood, POC Negative Negative  POCT CBC  Result Value Ref Range   WBC 9.2 4.6 - 10.2 K/uL   Lymph, poc 2.8 0.6 - 3.4   POC LYMPH PERCENT 29.9 10 - 50 %L   MID (cbc) 0.6 0 - 0.9   POC MID % 6.9 0 - 12 %M   POC Granulocyte 5.8 2 - 6.9   Granulocyte percent 63.2 37 - 80 %G   RBC 4.92 4.04 - 5.48 M/uL   Hemoglobin 13.2 12.2 - 16.2 g/dL   HCT, POC 16.1 09.6 - 47.9 %   MCV 76.8 (A) 80 - 97 fL   MCH, POC 26.8 (A) 27 - 31.2 pg   MCHC 34.9 31.8 - 35.4 g/dL   RDW, POC 04.5 %   Platelet Count, POC 269 142 - 424 K/uL   MPV 7.8 0 - 99.8 fL       Assessment & Plan:   1. Abdominal pain, left lower quadrant   2. Bacterial vaginosis   3. Uterine tenderness   Suspect ovarian cyst. RTC if worsens in the interim while waiting for [place].  Orders Placed This Encounter  Procedures  . GC/Chlamydia Probe Amp  . US Pelvis Complete    EPIC ORDER/ [id number]/NO NEEDS/INS-UHC/CLC/PT-FAX ORDER    Standing Status:   Future    Number of Occurrences:   1    Standing Expiration Date:   10/31/2017    Order Specific Question:   Reason for Exam (SYMPTOM  OR DIAGNOSIS REQUIRED)    Answer:   uterine tenderness and left lower quadrant pain    Order  Specific Question:   Preferred imaging location?    Answer:   [facility]  . US Transvaginal Non-OB    Standing Status:   Future    Number of Occurrences:   1    Standing Expiration Date:   10/31/2017    Order Specific Question:   Reason for Exam (SYMPTOM  OR DIAGNOSIS REQUIRED)    Answer:   uterine tenderness and left lower quandrant pain    Order Specific Question:   Preferred  imaging location?    Answer[facility]  . Comprehensive metabolic panel  . POCT urinalysis dipstick  . POCT urine pregnancy  . POCT Microscopic Urinalysis ([facility])  . POCT Wet + KOH Prep  . POC Hemoccult Bld/Stl (1-Cd Office Dx)  . POCT CBC    Meds ordered this encounter  Medications  . metroNIDAZOLE (FLAGYL) 500 MG tablet    Sig: Take 1 tablet (500 mg total) by mouth 2 (two) times daily.    Dispense:  14 tablet    Refill:  0    I personally performed the services described in this documentation, which was scribed in my presence. The recorded information has been reviewed and considered, and addended by me as needed.   Norberto SorensonEva Shaw, M.D.  [facility]  [place] [address] [place], [place] [address] [phone] phone [phone] fax  09/08/16 12:33 AM

## 2016-09-01 LAB — GC/CHLAMYDIA PROBE AMP
CT Probe RNA: NOT DETECTED
GC PROBE AMP APTIMA: NOT DETECTED

## 2016-09-01 LAB — COMPREHENSIVE METABOLIC PANEL
ALBUMIN: 4.4 g/dL (ref 3.6–5.1)
ALT: 13 U/L (ref 6–29)
AST: 14 U/L (ref 10–30)
Alkaline Phosphatase: 39 U/L (ref 33–115)
BUN: 7 mg/dL (ref 7–25)
CHLORIDE: 104 mmol/L (ref 98–110)
CO2: 25 mmol/L (ref 20–31)
CREATININE: 1.06 mg/dL (ref 0.50–1.10)
Calcium: 9.4 mg/dL (ref 8.6–10.2)
Glucose, Bld: 78 mg/dL (ref 65–99)
Potassium: 4.2 mmol/L (ref 3.5–5.3)
SODIUM: 138 mmol/L (ref 135–146)
Total Bilirubin: 0.3 mg/dL (ref 0.2–1.2)
Total Protein: 7 g/dL (ref 6.1–8.1)

## 2016-09-02 ENCOUNTER — Encounter: Payer: Self-pay | Admitting: Family Medicine

## 2016-09-06 ENCOUNTER — Ambulatory Visit
Admission: RE | Admit: 2016-09-06 | Discharge: 2016-09-06 | Disposition: A | Discharge status: Home / Self Care | Payer: 59 | Source: Ambulatory Visit | Attending: Family Medicine | Admitting: Family Medicine

## 2016-09-08 NOTE — Progress Notes (Signed)
Attempted to call pt, left VM for pt to call back  

## 2016-11-28 IMAGING — US US TRANSVAGINAL NON-OB
1 series · 14 of 25 positions shown · non-contrast
Comparison: None

CLINICAL DATA: Tenderness in uterus region. Left lower quadrant and
pelvic pain

EXAM:
TRANSABDOMINAL AND TRANSVAGINAL ULTRASOUND OF PELVIS
TECHNIQUE: Study was performed transabdominally to optimize pelvic field of
view evaluation and transvaginally to optimize internal visceral
architecture evaluation.

[Series 1: us transvaginal non-ob · 0.17mm/px · 14 of 58 slices shown]
[im 1/58]
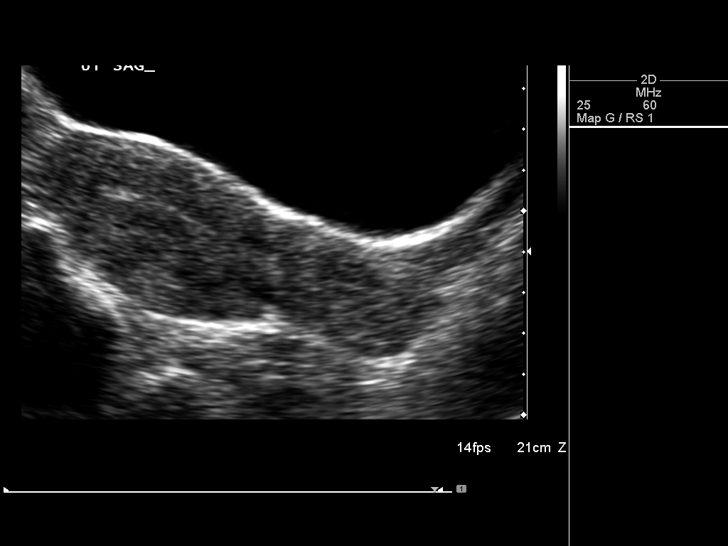
[im 5/58]
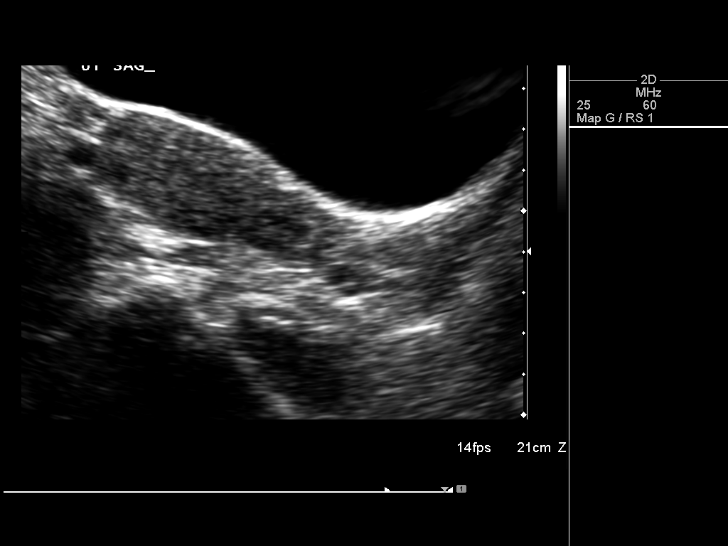
[im 10/58]
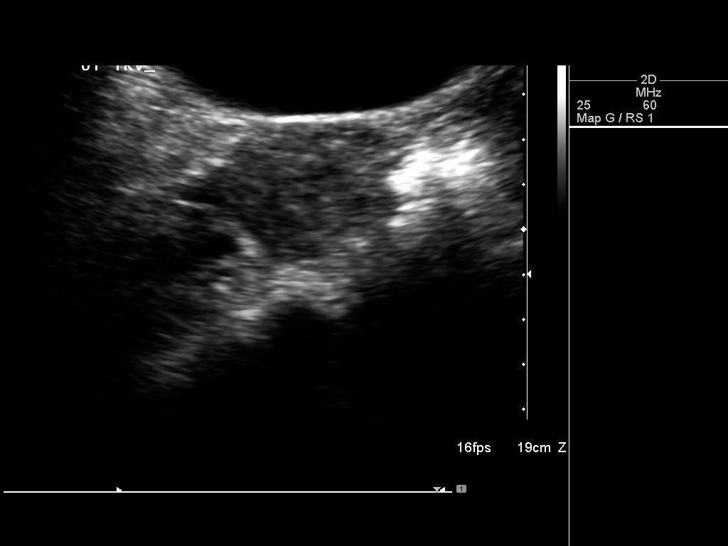
[im 15/58]
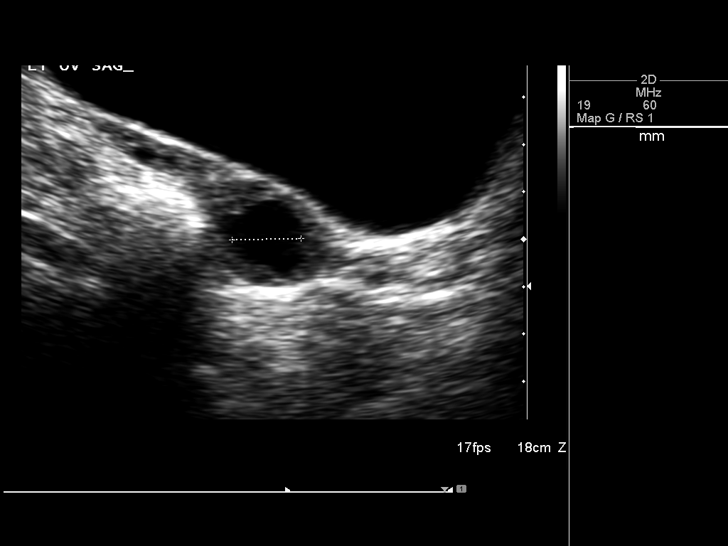
[im 20/58]
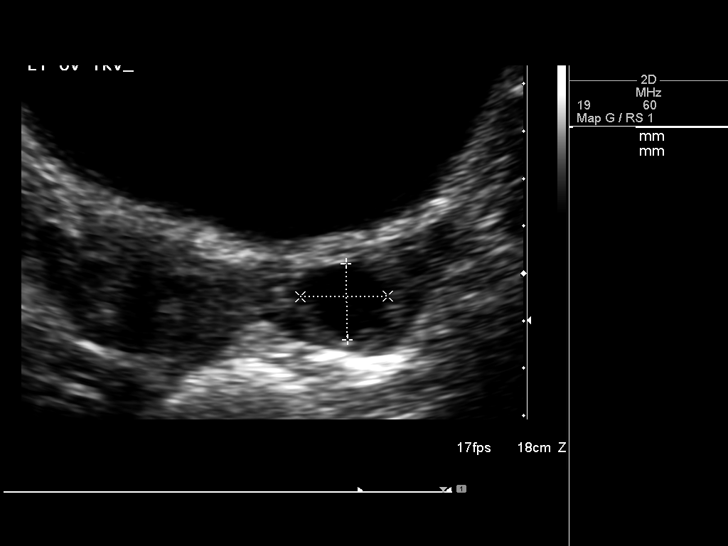
[im 22/58]
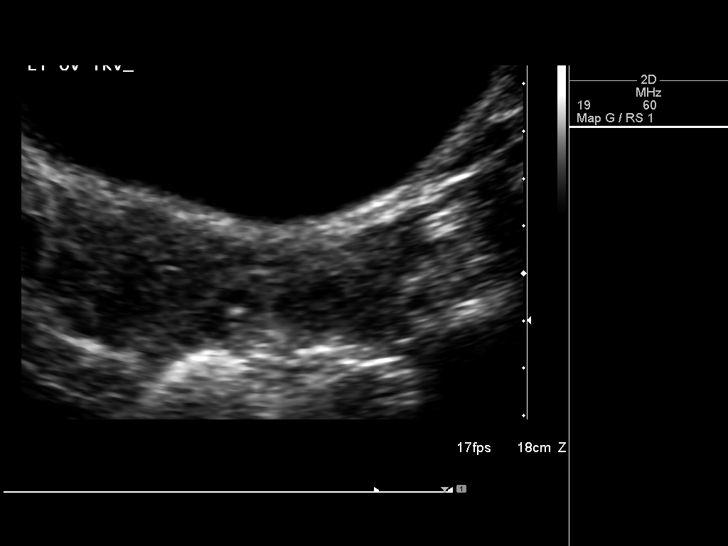
[im 27/58]
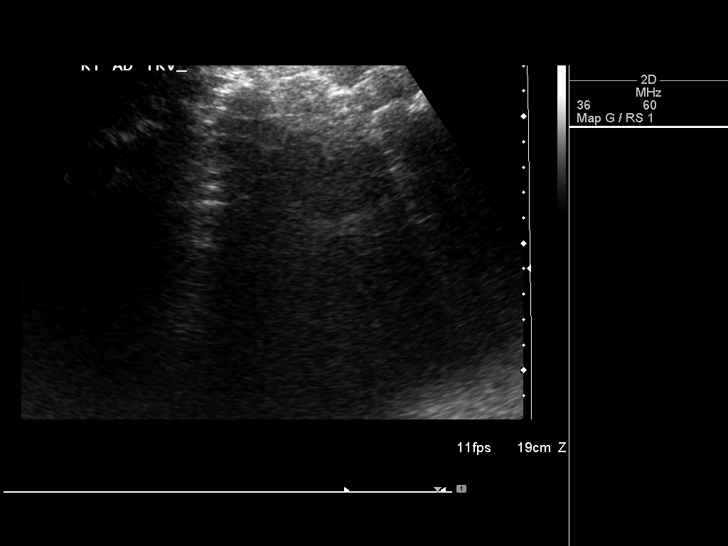
[im 31/58]
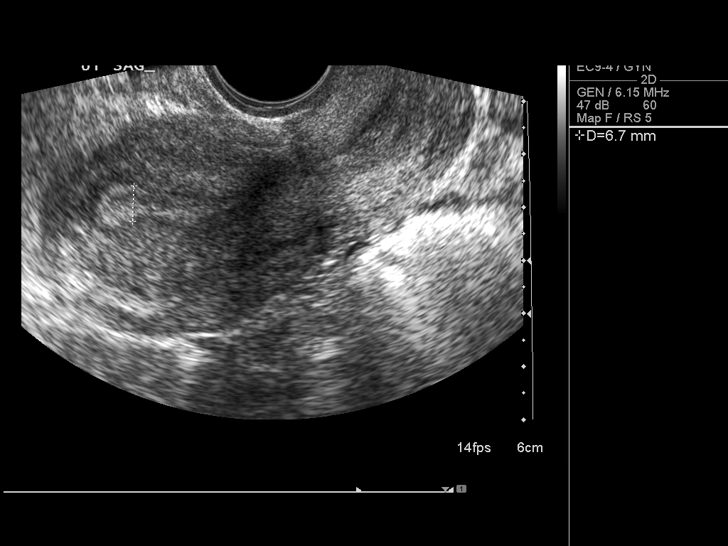
[im 36/58]
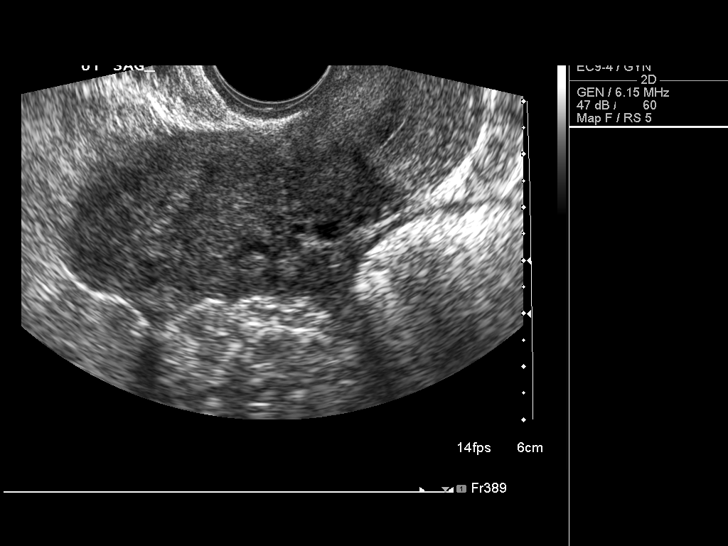
[im 39/58]
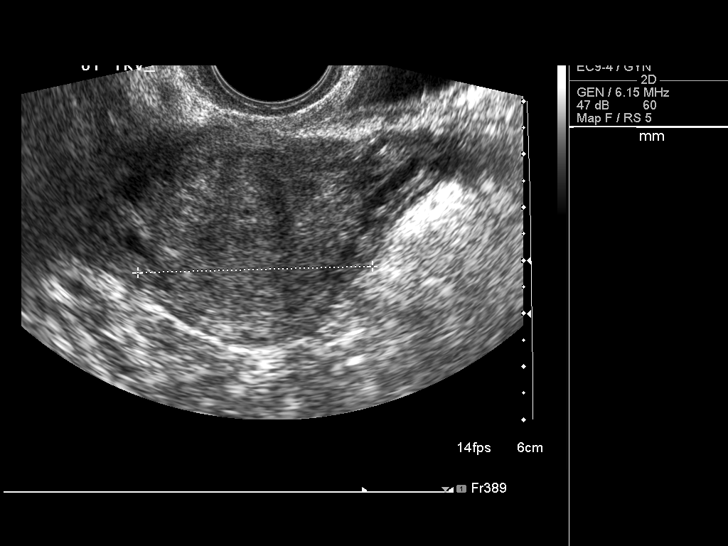
[im 43/58]
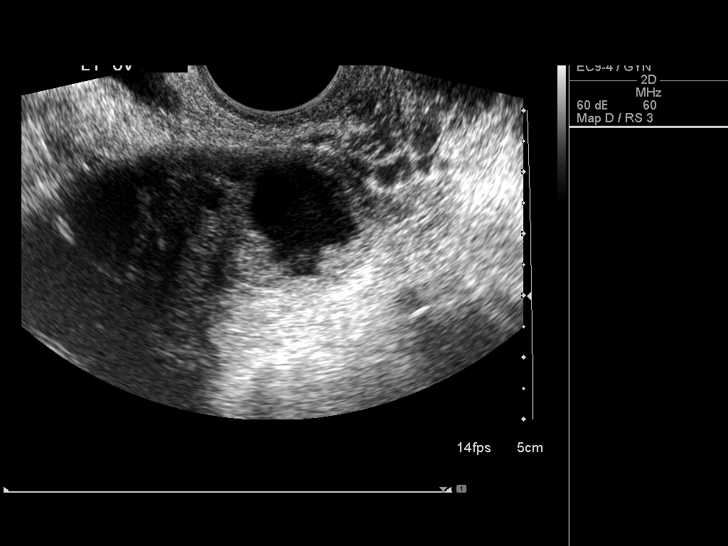
[im 48/58]
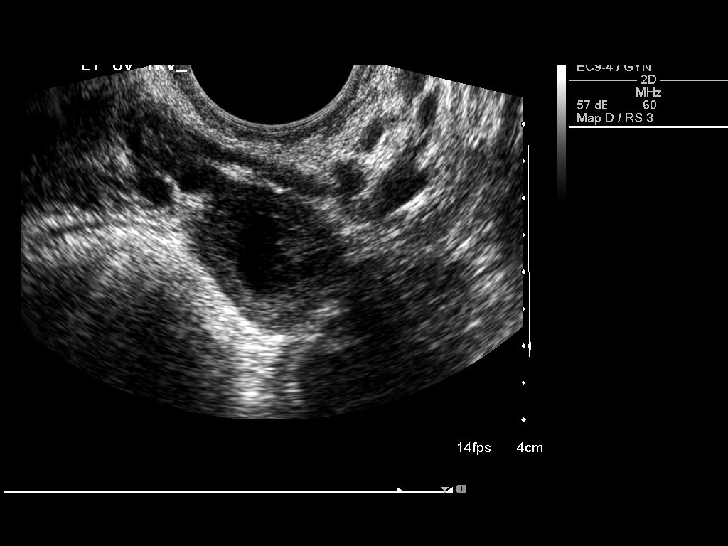
[im 53/58]
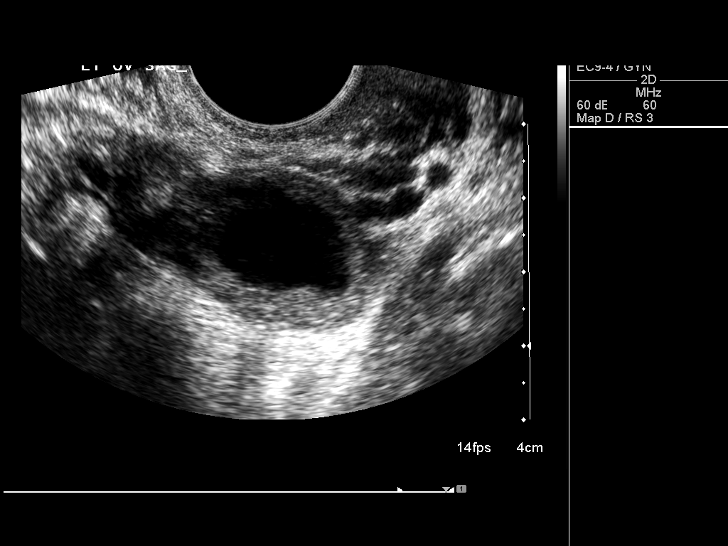
[im 58/58]
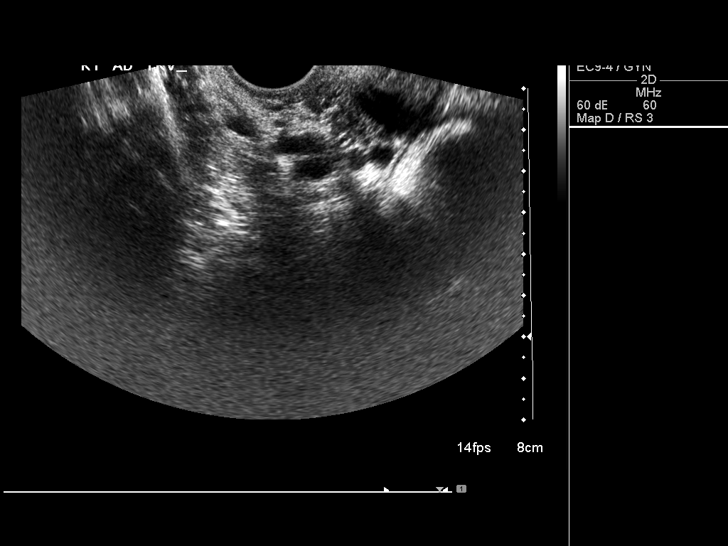

[14 of 25 positions shown; findings below may reference images not displayed]

FINDINGS: Uterus

Measurements: 8.7 x 4.2 x 4.4 cm. No fibroids or other mass
visualized.

Endometrium

Thickness: 7 mm.  No focal abnormality visualized.

Right ovary

Right ovary could not be visualized by transabdominal or
transvaginal technique. No right-sided pelvic mass evident.

Left ovary

Measurements: 3.5 x 2.4 x 1.9 cm. There is a 1.6 x 1.7 x 1.5 cm
dominant follicle in the left ovary. No other left pelvic/adnexal
mass.

Other findings

No abnormal free fluid.
IMPRESSION: Dominant follicle left ovary, felt to be physiologic.

Right ovary could not be visualized by transabdominal or
transvaginal technique. No right-sided pelvic mass evident by
ultrasound.

No intrauterine lesion.  No free pelvic fluid.

## 2016-12-04 ENCOUNTER — Other Ambulatory Visit: Payer: 59

## 2016-12-04 ENCOUNTER — Other Ambulatory Visit: Payer: Self-pay | Admitting: Obstetrics & Gynecology

## 2016-12-04 DIAGNOSIS — R221 Localized swelling, mass and lump, neck: Secondary | ICD-10-CM

## 2016-12-05 ENCOUNTER — Ambulatory Visit
Admission: RE | Admit: 2016-12-05 | Discharge: 2016-12-05 | Disposition: A | Discharge status: Home / Self Care | Payer: 59 | Source: Ambulatory Visit | Attending: Obstetrics & Gynecology | Admitting: Obstetrics & Gynecology

## 2016-12-05 DIAGNOSIS — R221 Localized swelling, mass and lump, neck: Secondary | ICD-10-CM

## 2016-12-20 ENCOUNTER — Other Ambulatory Visit: Payer: Self-pay | Admitting: Surgery

## 2017-02-24 NOTE — Progress Notes (Signed)
Subjective:    Patient ID: Carly RBernardine LangworthyDOB: 1986/08/17, 31 y.o.   MRN: [id number] Chief Complaint  Patient presents with  . Nasal Congestion    x2 weeks  . Sore Throat    x2 weeks     HPI  Carly Gordon is a delightful 31 yo woman here with a cough and a headache.  She has had itchy tearing eyes and sneezing for 2 weeks and then for the past 3-4 d she has experienced severe nasal congestion with sinus pain, pressure, heaviness. She is having a lot of post-nasal drip and cough productive of dark green mucous.  She is using a lot of steam treatments but no otc meds.  Several days ago she developed a fever so she decided to come in.  No ear pain.  A lot of fatigue.   Past Medical History:  Diagnosis Date  . Anemia   . Postpartum care following cesarean delivery (6/12) 04/09/2014  . Psoriasis    legs   Past Surgical History:  Procedure Laterality Date  . CESAREAN SECTION  02/2012   x 1 [place]  . CESAREAN SECTION N/A 04/09/2014   Procedure: Repeat CESAREAN SECTION;  Surgeon: Robley Fries, MD;  Location: [facility];  Service: Obstetrics;  Laterality: N/A;  EDD: 04/06/14   Current Outpatient Prescriptions on File Prior to Visit  Medication Sig Dispense Refill  . clobetasol ointment (TEMOVATE) 0.05 % Apply 1 application topically 2 (two) times daily. 30 g 0   No current facility-administered medications on file prior to visit.    No Known Allergies Family History  Problem Relation Age of Onset  . Diabetes Mother   . Diabetes Father    Social History   Social History  . Marital status: Married    Spouse name: N/A  . Number of children: N/A  . Years of education: N/A   Social History Main Topics  . Smoking status: Never Smoker  . Smokeless tobacco: Never Used  . Alcohol use No  . Drug use: No  . Sexual activity: Yes    Birth control/ protection: None     Comment: pregnant   Other Topics Concern  . None   Social History Narrative  . None    Depression screen [facility] 2/9 02/25/2017 08/31/2016 07/27/2016 01/30/2016 07/12/2015  Decreased Interest 0 0 0 0 0  Down, Depressed, Hopeless 0 0 0 0 0  PHQ - 2 Score 0 0 0 0 0    Review of Systems  see hpi    Objective:   Physical Exam  Constitutional: She is oriented to person, place, and time. She appears well-developed and well-nourished. She does not appear ill. No distress.  HENT:  Head: Normocephalic and atraumatic.  Right Ear: External ear and ear canal normal. Tympanic membrane is retracted. A middle ear effusion is present.  Left Ear: External ear and ear canal normal. Tympanic membrane is retracted. A middle ear effusion is present.  Nose: Mucosal edema and rhinorrhea present. Right sinus exhibits maxillary sinus tenderness. Left sinus exhibits maxillary sinus tenderness.  Mouth/Throat: Uvula is midline and mucous membranes are normal. Oral lesions present. Posterior oropharyngeal erythema present. No oropharyngeal exudate, posterior oropharyngeal edema or tonsillar abscesses.    Eyes: Conjunctivae are normal. Right eye exhibits no discharge. Left eye exhibits no discharge. No scleral icterus.  Neck: Normal range of motion. Neck supple.  Cardiovascular: Normal rate, regular rhythm, normal heart sounds and intact distal pulses.   Pulmonary/Chest: Effort  normal and breath sounds normal.  Genitourinary: No breast swelling, tenderness, discharge or bleeding.  Genitourinary Comments: Pin point rough papule on left breast about 9 oclock 1 cm from areola, skin colored and limited to superficial skin.  Lymphadenopathy:       Head (right side): Submandibular adenopathy present. No preauricular and no posterior auricular adenopathy present.       Head (left side): Submandibular adenopathy present. No preauricular and no posterior auricular adenopathy present.    She has no cervical adenopathy.       Right: No supraclavicular adenopathy present.       Left: No supraclavicular adenopathy  present.  Neurological: She is alert and oriented to person, place, and time.  Skin: Skin is warm and dry. No lesion and no rash noted. She is not diaphoretic. No erythema.  Psychiatric: She has a normal mood and affect. Her behavior is normal.         BP 98/66   Pulse 85   Temp 97.6 F (36.4 C) (Oral)   Resp 18   Ht 5' 3.19" (1.605 m)   Wt 131 lb 3.2 oz (59.5 kg)   LMP 02/12/2017   SpO2 99%   BMI 23.10 kg/m   Assessment & Plan:   1. Acute non-recurrent frontal sinusitis   2. Hay fever   3. Allergic conjunctivitis and rhinitis, bilateral   4. Aphthous ulcer of mouth - likely secondary to immunosuppresion with seasonal allergies and sinusitis. Avoid sugar, practice good dental hygeine. Can use otc topical prn pain if desires. RTC or to dentist if does not resolve within a wk or if recurs with frequency.  5. Skin lesion of breast - appears to be a tiny excoriated follicle - pt reassured, RTC if does not resolve within 1-2 wks.  6. Skin tag - reassure benign, RTC for removal if irritated/inflammed or prefers.    Meds ordered this encounter  Medications  . azelastine (OPTIVAR) 0.05 % ophthalmic solution    Sig: Place 1 drop into both eyes 2 (two) times daily.    Dispense:  6 mL    Refill:  1  . ipratropium (ATROVENT) 0.03 % nasal spray    Sig: Place 2 sprays into the nose 4 (four) times daily.    Dispense:  30 mL    Refill:  1  . fluticasone (FLONASE) 50 MCG/ACT nasal spray    Sig: Place 2 sprays into both nostrils at bedtime.    Dispense:  16 g    Refill:  2  . amoxicillin (AMOXIL) 500 MG capsule    Sig: Take 2 capsules (1,000 mg total) by mouth 2 (two) times daily.    Dispense:  28 capsule    Refill:  0  . Dextromethorphan-Guaifenesin (MUCINEX DM MAXIMUM STRENGTH) 60-1200 MG TB12    Sig: Take 1 tablet by mouth every 12 (twelve) hours.    Dispense:  14 each    Refill:  1     Norberto Sorenson, M.D.  Primary Care at [facility] [address] [place], [place]  [address] [phone] phone [phone] fax  03/12/17 2:55 PM

## 2017-02-25 ENCOUNTER — Encounter: Payer: Self-pay | Admitting: Family Medicine

## 2017-02-25 ENCOUNTER — Ambulatory Visit (INDEPENDENT_AMBULATORY_CARE_PROVIDER_SITE_OTHER): Payer: 59 | Admitting: Family Medicine

## 2017-02-25 ENCOUNTER — Ambulatory Visit: Payer: 59

## 2017-02-25 VITALS — BP 98/66 | HR 85 | Temp 97.6°F | Resp 18 | Ht 63.19 in | Wt 131.2 lb

## 2017-02-25 DIAGNOSIS — K120 Recurrent oral aphthae: Secondary | ICD-10-CM | POA: Diagnosis not present

## 2017-02-25 DIAGNOSIS — J0110 Acute frontal sinusitis, unspecified: Secondary | ICD-10-CM

## 2017-02-25 DIAGNOSIS — L918 Other hypertrophic disorders of the skin: Secondary | ICD-10-CM | POA: Diagnosis not present

## 2017-02-25 DIAGNOSIS — N649 Disorder of breast, unspecified: Secondary | ICD-10-CM

## 2017-02-25 DIAGNOSIS — L988 Other specified disorders of the skin and subcutaneous tissue: Secondary | ICD-10-CM

## 2017-02-25 DIAGNOSIS — H1013 Acute atopic conjunctivitis, bilateral: Secondary | ICD-10-CM

## 2017-02-25 DIAGNOSIS — J301 Allergic rhinitis due to pollen: Secondary | ICD-10-CM

## 2017-02-25 DIAGNOSIS — J309 Allergic rhinitis, unspecified: Secondary | ICD-10-CM

## 2017-02-25 MED ORDER — MUCINEX DM MAXIMUM STRENGTH 60-1200 MG PO TB12: 1.0000 | ORAL_TABLET | Freq: Two times a day (BID) | ORAL | 1 refills | Status: DC

## 2017-02-25 MED ORDER — AMOXICILLIN 500 MG PO CAPS: 1000.0000 mg | ORAL_CAPSULE | Freq: Two times a day (BID) | ORAL | 0 refills | Status: DC

## 2017-02-25 MED ORDER — FLUTICASONE PROPIONATE 50 MCG/ACT NA SUSP: 2.0000 | Freq: Every day | NASAL | 2 refills | Status: DC

## 2017-02-25 MED ORDER — AZELASTINE HCL 0.05 % OP SOLN: 1.0000 [drp] | Freq: Two times a day (BID) | OPHTHALMIC | 1 refills | Status: DC

## 2017-02-25 MED ORDER — IPRATROPIUM BROMIDE 0.03 % NA SOLN: 2.0000 | Freq: Four times a day (QID) | NASAL | 1 refills | Status: DC

## 2017-02-25 NOTE — Patient Instructions (Addendum)
Hot showers or breathing in steam may help loosen the congestion.  Using a netti pot or sinus rinse is also likely to help you feel better and keep this from progressing.  Use the ipratropium nasal spray as needed throughout the day- try to get it in all 4 times - and use the fluticasone nasal spray every night before bed for at least 2 weeks.  I recommend augmenting with mucinex DM to help you move out the congestion and suppress the cough. Then use the eye drops and the antibiotic amoxicillin both twice a day for a week. If no improvement or you are getting worse, come back as you might need a course of steroids but hopefully with all of the above, you can avoid it.    IF you received an x-ray today, you will receive an invoice from [facility]. Please contact [facility] at [phone] with questions or concerns regarding your invoice.   IF you received labwork today, you will receive an invoice from [facility]. Please contact [facility] at [phone] with questions or concerns regarding your invoice.   Our billing staff will not be able to assist you with questions regarding bills from these companies.  You will be contacted with the lab results as soon as they are available. The fastest way to get your results is to activate your My Chart account. Instructions are located on the last page of this paperwork. If you have not heard from [place] regarding the results in 2 weeks, please contact this office.      Allergic Conjunctivitis, Adult Allergic conjunctivitis is inflammation of the clear membrane that covers the white part of your eye and the inner surface of your eyelid (conjunctiva). The inflammation is caused by allergies. The blood vessels in the conjunctiva become inflamed and this causes the eyes to become red or pink. The eyes often feel itchy. Allergic conjunctivitis cannot be spread from one person to another person (is not contagious). What are the causes? This  condition is caused by an allergic reaction. Common causes of an allergic reaction (allergens) include:  Outdoor allergens, such as:  Pollen.  Grass and weeds.  Mold spores.  Indoor allergens, such as:  Dust.  Smoke.  Mold.  Pet dander.  Animal hair. What increases the risk? You may be more likely to develop this condition if you have a family history of allergies, such as:  Allergic rhinitis.  Bronchial asthma.  Atopic dermatitis. What are the signs or symptoms? Symptoms of this condition include eyes that are:  Itchy.  Red.  Watery.  Puffy. Your eyes may also:  Sting or burn.  Have clear drainage coming from them. How is this diagnosed? This condition may be diagnosed by medical history and physical exam. If you have drainage from your eyes, it may be tested to rule out other causes of conjunctivitis. You may also need to see a health care provider who specializes in treating allergies (allergist) or eye conditions (ophthalmologist) for tests to confirm the diagnosis. You may have:  Skin tests to see which allergens are causing your symptoms. These tests involve pricking the skin with a tiny needle and exposing the skin to small amounts of potential allergens to see if your skin reacts.  Blood tests.  Tissue scrapings from your eyelid. These will be examined under a microscope. How is this treated? Treatments for this condition may include:  Cold cloths (compresses) to soothe itching and swelling.  Washing the face to remove allergens.  Eye  drops. These may be prescription or over-the-counter. There are several different types. You may need to try different types to see which one works best for you. Your may need:  Eye drops that block the allergic reaction (antihistamine).  Eye drops that reduce swelling and irritation (anti-inflammatory).  Steroid eye drops to lessen a severe reaction (vernal conjunctivitis).  Oral antihistamine medicines to  reduce your allergic reaction. You may need these if eye drops do not help or are difficult to use. Follow these instructions at home:  Avoid known allergens whenever possible.  Take or apply over-the-counter and prescription medicines only as told by your health care provider. These include any eye drops.  Apply a cool, clean washcloth to your eye for 10-20 minutes, 3-4 times a day.  Do not touch or rub your eyes.  Do not wear contact lenses until the inflammation is gone. Wear glasses instead.  Do not wear eye makeup until the inflammation is gone.  Keep all follow-up visits as told by your health care provider. This is important. Contact a health care provider if:  Your symptoms get worse or do not improve with treatment.  You have mild eye pain.  You have sensitivity to light.  You have spots or blisters on your eyes.  You have pus draining from your eye.  You have a fever. Get help right away if:  You have redness, swelling, or other symptoms in only one eye.  Your vision is blurred or you have vision changes.  You have severe eye pain. This information is not intended to replace advice given to you by your health care provider. Make sure you discuss any questions you have with your health care provider. Document Released: 01/05/2003 Document Revised: 06/13/2016 Document Reviewed: 04/27/2016 Elsevier Interactive Patient Education  2017 Elsevier Inc.  Canker Sores Canker sores are small, painful sores that develop inside your mouth. They may also be called aphthous ulcers. You can get canker sores on the inside of your lips or cheeks, on your tongue, or anywhere inside your mouth. You can have just one canker sore or several of them. Canker sores cannot be passed from one person to another (noncontagious). These sores are different than the sores that you may get on the outside of your lips (cold sores or fever blisters). Canker sores usually start as painful red bumps.  Then they turn into small white, yellow, or gray ulcers that have red borders. The ulcers may be quite painful. The pain may be worse when you eat or drink. What are the causes? The cause of this condition is not known. What increases the risk? This condition is more likely to develop in:  Women.  People in their teens or 29s.  Women who are having their menstrual period.  People who are under a lot of emotional stress.  People who do not get enough iron or B vitamins.  People who have poor oral hygiene.  People who have an injury inside the mouth. This can happen after having dental work or from chewing something hard. What are the signs or symptoms? Along with the canker sore, symptoms may also include:  Fever.  Fatigue.  Swollen lymph nodes in your neck. How is this diagnosed? This condition can be diagnosed based on your symptoms. Your health care provider will also examine your mouth. Your health care provider may also do tests if you get canker sores often or if they are very bad. Tests may include:  Blood tests to  rule out other causes of canker sores.  Taking swabs from the sore to check for infection.  Taking a small piece of skin from the sore (biopsy) to test it for cancer. How is this treated? Most canker sores clear up without treatment in about 10 days. Home care is usually the only treatment that you will need. Over-the-counter medicines can relieve discomfort.If you have severe canker sores, your health care provider may prescribe:  Numbing ointment to relieve pain.  Vitamins.  Steroid medicines. These may be given as:  Oral pills.  Mouth rinses.  Gels.  Antibiotic mouth rinse. Follow these instructions at home:  Apply, take, or use medicines only as directed by your health care provider. These include vitamins.  If you were prescribed an antibiotic mouth rinse, finish all of it even if you start to feel better.  Until the sores are  healed:  Do not drink coffee or citrus juices.  Do not eat spicy or salty foods.  Use a mild, over-the-counter mouth rinse as directed by your health care provider.  Practice good oral hygiene.  Floss your teeth every day.  Brush your teeth with a soft brush twice each day. Contact a health care provider if:  Your symptoms do not get better after two weeks.  You also have a fever or swollen glands.  You get canker sores often.  You have a canker sore that is getting larger.  You cannot eat or drink due to your canker sores. This information is not intended to replace advice given to you by your health care provider. Make sure you discuss any questions you have with your health care provider. Document Released: 02/09/2011 Document Revised: 03/22/2016 Document Reviewed: 09/15/2014 Elsevier Interactive Patient Education  2017 [employer].

## 2018-06-05 ENCOUNTER — Other Ambulatory Visit: Payer: Self-pay

## 2018-06-05 ENCOUNTER — Encounter: Payer: Self-pay | Admitting: Family Medicine

## 2018-06-05 ENCOUNTER — Ambulatory Visit: Payer: BLUE CROSS/BLUE SHIELD | Admitting: Family Medicine

## 2018-06-05 VITALS — BP 108/66 | HR 67 | Temp 98.5°F | Ht 63.0 in | Wt 138.8 lb

## 2018-06-05 DIAGNOSIS — J019 Acute sinusitis, unspecified: Secondary | ICD-10-CM | POA: Diagnosis not present

## 2018-06-05 DIAGNOSIS — R05 Cough: Secondary | ICD-10-CM

## 2018-06-05 DIAGNOSIS — R059 Cough, unspecified: Secondary | ICD-10-CM

## 2018-06-05 MED ORDER — AMOXICILLIN-POT CLAVULANATE 875-125 MG PO TABS: 1.0000 | ORAL_TABLET | Freq: Two times a day (BID) | ORAL | 0 refills | Status: DC

## 2018-06-05 MED ORDER — HYDROCODONE-HOMATROPINE 5-1.5 MG/5ML PO SYRP
ORAL_SOLUTION | ORAL | 0 refills | Status: DC
Start: 2018-06-05 — End: 2019-11-06

## 2018-06-05 NOTE — Progress Notes (Signed)
Subjective:    Patient ID: Carly Gordon, female    DOB: [birth year], 32 y.o.   MRN: [id number]  HPI Carly Gordon is a 32 y.o. female Presents today for: Chief Complaint  Patient presents with  . Sinus Problem    head is heavy and pain on the face and around the ears  . Cough  . Fever    off and on   Seen July 26.  Subjective fever one night prior with sore throat bilateral ear pain at that time.  Positive sick contact,  suspected viral URI.  Symptomatic care discussed.   More head ache and congestion. Pain and pressure with leaning forward. Fever 100 few days ago. Some nasal congestion. Green nasal discharge. Cough with similar phlegm. No tooth pain, but forehead and cheek pain extending to ears. Sleeping ok, but HA at night worse part. Cough at times. HA is primary concern.   Tx: steam bath. Tylenol. Other otc meds.   Patient Active Problem List   Diagnosis Date Noted  . Cholelithiasis 06/27/2015  . Hepatic steatosis 06/27/2015  . Postpartum care following cesarean delivery (6/12) 04/09/2014  . Cesarean delivery delivered 04/09/2014   Past Medical History:  Diagnosis Date  . Anemia   . Postpartum care following cesarean delivery (6/12) 04/09/2014  . Psoriasis    legs   Past Surgical History:  Procedure Laterality Date  . CESAREAN SECTION  02/2012   x 1 [place]  . CESAREAN SECTION N/A 04/09/2014   Procedure: Repeat CESAREAN SECTION;  Surgeon: Robley Fries, MD;  Location: [facility];  Service: Obstetrics;  Laterality: N/A;  EDD: 04/06/14   No Known Allergies Prior to Admission medications   Not on File   Social History   Socioeconomic History  . Marital status: Married    Spouse name: Not on file  . Number of children: Not on file  . Years of education: Not on file  . Highest education level: Not on file  Occupational History  . Not on file  Social Needs  . Financial resource strain: Not on file  . Food insecurity:    Worry: Not on file      Inability: Not on file  . Transportation needs:    Medical: Not on file    Non-medical: Not on file  Tobacco Use  . Smoking status: Never Smoker  . Smokeless tobacco: Never Used  Substance and Sexual Activity  . Alcohol use: No  . Drug use: No  . Sexual activity: Yes    Birth control/protection: None    Comment: pregnant  Lifestyle  . Physical activity:    Days per week: Not on file    Minutes per session: Not on file  . Stress: Not on file  Relationships  . Social connections:    Talks on phone: Not on file    Gets together: Not on file    Attends religious service: Not on file    Active member of club or organization: Not on file    Attends meetings of clubs or organizations: Not on file    Relationship status: Not on file  . Intimate partner violence:    Fear of current or ex partner: Not on file    Emotionally abused: Not on file    Physically abused: Not on file    Forced sexual activity: Not on file  Other Topics Concern  . Not on file  Social History Narrative  . Not on file    Review of  Systems As above.     Objective:   Physical Exam  Constitutional: She is oriented to person, place, and time. She appears well-developed and well-nourished. No distress.  HENT:  Head: Normocephalic and atraumatic.  Right Ear: Hearing, tympanic membrane, external ear and ear canal normal.  Left Ear: Hearing, tympanic membrane, external ear and ear canal normal.  Nose: Nose normal.  Mouth/Throat: Oropharynx is clear and moist. No oropharyngeal exudate.  Eyes: Pupils are equal, round, and reactive to light. Conjunctivae and EOM are normal.  Cardiovascular: Normal rate, regular rhythm, normal heart sounds and intact distal pulses.  No murmur heard. Pulmonary/Chest: Effort normal and breath sounds normal. No respiratory distress. She has no wheezes. She has no rhonchi.  Neurological: She is alert and oriented to person, place, and time.  nonfocal  Skin: Skin is warm and  dry. No rash noted.  Psychiatric: She has a normal mood and affect. Her behavior is normal.  Vitals reviewed.  Vitals:   06/05/18 0810  BP: 108/66  Pulse: 67  Temp: 98.5 F (36.9 C)  TempSrc: Oral  SpO2: 100%  Weight: 138 lb 12.8 oz (63 kg)  Height: 5\' 3"  (1.6 m)       Assessment & Plan:    Carly Gordon is a 32 y.o. female Acute sinusitis, recurrence not specified, unspecified location - Plan: amoxicillin-clavulanate (AUGMENTIN) 875-125 MG tablet  Cough - Plan: HYDROcodone-homatropine (HYCODAN) 5-1.5 MG/5ML syrup Initially suspected viral illness, now with signs of sinusitis.    - Continued symptomatic care discussed with saline nasal spray, Mucinex, start Augmentin.   -  Tylenol or Motrin as needed for headache, hydrocodone cough syrup at night if needed for cough and headache.  She asked about scan but do not see an acute reason for CT scan at this time.  RTC precautions discussed if not improving, sooner if worse.    Meds ordered this encounter  Medications  . amoxicillin-clavulanate (AUGMENTIN) 875-125 MG tablet    Sig: Take 1 tablet by mouth 2 (two) times daily.    Dispense:  20 tablet    Refill:  0  . HYDROcodone-homatropine (HYCODAN) 5-1.5 MG/5ML syrup    Sig: 45m by mouth a bedtime as needed for cough.    Dispense:  120 mL    Refill:  0   Patient Instructions   Your symptoms appear to be due to sinusitis.  Start antibiotic twice per day.  Saline nasal spray, Mucinex over-the-counter may also be helpful.  Tylenol or Motrin if needed for headache, but I also wrote for a stronger cough syrup at night that has pain medication in it if needed.  Return to the clinic or go to the nearest emergency room if any of your symptoms worsen or new symptoms occur.   Sinusitis, Adult Sinusitis is soreness and inflammation of your sinuses. Sinuses are hollow spaces in the bones around your face. Your sinuses are located:  Around your eyes.  In the middle of your  forehead.  Behind your nose.  In your cheekbones.  Your sinuses and nasal passages are lined with a stringy fluid (mucus). Mucus normally drains out of your sinuses. When your nasal tissues become inflamed or swollen, the mucus can become trapped or blocked so air cannot flow through your sinuses. This allows bacteria, viruses, and funguses to grow, which leads to infection. Sinusitis can develop quickly and last for 7?10 days (acute) or for more than 12 weeks (chronic). Sinusitis often develops after a cold. What are the causes?  This condition is caused by anything that creates swelling in the sinuses or stops mucus from draining, including:  Allergies.  Asthma.  Bacterial or viral infection.  Abnormally shaped bones between the nasal passages.  Nasal growths that contain mucus (nasal polyps).  Narrow sinus openings.  Pollutants, such as chemicals or irritants in the air.  A foreign object stuck in the nose.  A fungal infection. This is rare.  What increases the risk? The following factors may make you more likely to develop this condition:  Having allergies or asthma.  Having had a recent cold or respiratory tract infection.  Having structural deformities or blockages in your nose or sinuses.  Having a weak immune system.  Doing a lot of swimming or diving.  Overusing nasal sprays.  Smoking.  What are the signs or symptoms? The main symptoms of this condition are pain and a feeling of pressure around the affected sinuses. Other symptoms include:  Upper toothache.  Earache.  Headache.  Bad breath.  Decreased sense of smell and taste.  A cough that may get worse at night.  Fatigue.  Fever.  Thick drainage from your nose. The drainage is often green and it may contain pus (purulent).  Stuffy nose or congestion.  Postnasal drip. This is when extra mucus collects in the throat or back of the nose.  Swelling and warmth over the affected  sinuses.  Sore throat.  Sensitivity to light.  How is this diagnosed? This condition is diagnosed based on symptoms, a medical history, and a physical exam. To find out if your condition is acute or chronic, your health care provider may:  Look in your nose for signs of nasal polyps.  Tap over the affected sinus to check for signs of infection.  View the inside of your sinuses using an imaging device that has a light attached (endoscope).  If your health care provider suspects that you have chronic sinusitis, you may also:  Be tested for allergies.  Have a sample of mucus taken from your nose (nasal culture) and checked for bacteria.  Have a mucus sample examined to see if your sinusitis is related to an allergy.  If your sinusitis does not respond to treatment and it lasts longer than 8 weeks, you may have an MRI or CT scan to check your sinuses. These scans also help to determine how severe your infection is. In rare cases, a bone biopsy may be done to rule out more serious types of fungal sinus disease. How is this treated? Treatment for sinusitis depends on the cause and whether your condition is chronic or acute. If a virus is causing your sinusitis, your symptoms will go away on their own within 10 days. You may be given medicines to relieve your symptoms, including:  Topical nasal decongestants. They shrink swollen nasal passages and let mucus drain from your sinuses.  Antihistamines. These drugs block inflammation that is triggered by allergies. This can help to ease swelling in your nose and sinuses.  Topical nasal corticosteroids. These are nasal sprays that ease inflammation and swelling in your nose and sinuses.  Nasal saline washes. These rinses can help to get rid of thick mucus in your nose.  If your condition is caused by bacteria, you will be given an antibiotic medicine. If your condition is caused by a fungus, you will be given an antifungal medicine. Surgery  may be needed to correct underlying conditions, such as narrow nasal passages. Surgery may also be needed to  remove polyps. Follow these instructions at home: Medicines  Take, use, or apply over-the-counter and prescription medicines only as told by your health care provider. These may include nasal sprays.  If you were prescribed an antibiotic medicine, take it as told by your health care provider. Do not stop taking the antibiotic even if you start to feel better. Hydrate and Humidify  Drink enough water to keep your urine clear or pale yellow. Staying hydrated will help to thin your mucus.  Use a cool mist humidifier to keep the humidity level in your home above 50%.  Inhale steam for 10-15 minutes, 3-4 times a day or as told by your health care provider. You can do this in the bathroom while a hot shower is running.  Limit your exposure to cool or dry air. Rest  Rest as much as possible.  Sleep with your head raised (elevated).  Make sure to get enough sleep each night. General instructions  Apply a warm, moist washcloth to your face 3-4 times a day or as told by your health care provider. This will help with discomfort.  Wash your hands often with soap and water to reduce your exposure to viruses and other germs. If soap and water are not available, use hand sanitizer.  Do not smoke. Avoid being around people who are smoking (secondhand smoke).  Keep all follow-up visits as told by your health care provider. This is important. Contact a health care provider if:  You have a fever.  Your symptoms get worse.  Your symptoms do not improve within 10 days. Get help right away if:  You have a severe headache.  You have persistent vomiting.  You have pain or swelling around your face or eyes.  You have vision problems.  You develop confusion.  Your neck is stiff.  You have trouble breathing. This information is not intended to replace advice given to you by your  health care provider. Make sure you discuss any questions you have with your health care provider. Document Released: 10/15/2005 Document Revised: 06/10/2016 Document Reviewed: 08/10/2015 Elsevier Interactive Patient Education  2018 [employer]    IF you received an x-ray today, you will receive an invoice from [facility]. Please contact [facility] at [phone] with questions or concerns regarding your invoice.   IF you received labwork today, you will receive an invoice from [facility]. Please contact [facility] at [phone] with questions or concerns regarding your invoice.   Our billing staff will not be able to assist you with questions regarding bills from these companies.  You will be contacted with the lab results as soon as they are available. The fastest way to get your results is to activate your My Chart account. Instructions are located on the last page of this paperwork. If you have not heard from us regarding the results in 2 weeks, please contact this office.       Signed,   Meredith StaggersJeffrey Gibran Veselka, MD [facility].  06/05/18 8:31 AM

## 2018-06-05 NOTE — Telephone Encounter (Signed)
I have called pt back and informed her of the information from the provider above. She stated that the medication is usually prescribed by a physician that she sees that day, since she doesn't officially have a PCP.  I stated that she needs to be see for that issue for it to be prescribed and she stated understanding. Carly Gordon  Carly Gordon

## 2018-06-05 NOTE — Telephone Encounter (Signed)
Pt is requesting the Clobetasol ointment for her Psorasis. She for got to mention in her appointment. Please advise on refill.   Thanks, [employer]

## 2018-06-05 NOTE — Telephone Encounter (Signed)
Unfortunately I have not discussed this medication or use with her and it does not appear that has been discussed in the past few years.  Please schedule appointment at her convenience to review that medication and its use and can refill it at that time if needed

## 2018-06-05 NOTE — Patient Instructions (Addendum)
Your symptoms appear to be due to sinusitis.  Start antibiotic twice per day.  Saline nasal spray, Mucinex over-the-counter may also be helpful.  Tylenol or Motrin if needed for headache, but I also wrote for a stronger cough syrup at night that has pain medication in it if needed.  Return to the clinic or go to the nearest emergency room if any of your symptoms worsen or new symptoms occur.   Sinusitis, Adult Sinusitis is soreness and inflammation of your sinuses. Sinuses are hollow spaces in the bones around your face. Your sinuses are located:  Around your eyes.  In the middle of your forehead.  Behind your nose.  In your cheekbones.  Your sinuses and nasal passages are lined with a stringy fluid (mucus). Mucus normally drains out of your sinuses. When your nasal tissues become inflamed or swollen, the mucus can become trapped or blocked so air cannot flow through your sinuses. This allows bacteria, viruses, and funguses to grow, which leads to infection. Sinusitis can develop quickly and last for 7?10 days (acute) or for more than 12 weeks (chronic). Sinusitis often develops after a cold. What are the causes? This condition is caused by anything that creates swelling in the sinuses or stops mucus from draining, including:  Allergies.  Asthma.  Bacterial or viral infection.  Abnormally shaped bones between the nasal passages.  Nasal growths that contain mucus (nasal polyps).  Narrow sinus openings.  Pollutants, such as chemicals or irritants in the air.  A foreign object stuck in the nose.  A fungal infection. This is rare.  What increases the risk? The following factors may make you more likely to develop this condition:  Having allergies or asthma.  Having had a recent cold or respiratory tract infection.  Having structural deformities or blockages in your nose or sinuses.  Having a weak immune system.  Doing a lot of swimming or diving.  Overusing nasal  sprays.  Smoking.  What are the signs or symptoms? The main symptoms of this condition are pain and a feeling of pressure around the affected sinuses. Other symptoms include:  Upper toothache.  Earache.  Headache.  Bad breath.  Decreased sense of smell and taste.  A cough that may get worse at night.  Fatigue.  Fever.  Thick drainage from your nose. The drainage is often green and it may contain pus (purulent).  Stuffy nose or congestion.  Postnasal drip. This is when extra mucus collects in the throat or back of the nose.  Swelling and warmth over the affected sinuses.  Sore throat.  Sensitivity to light.  How is this diagnosed? This condition is diagnosed based on symptoms, a medical history, and a physical exam. To find out if your condition is acute or chronic, your health care provider may:  Look in your nose for signs of nasal polyps.  Tap over the affected sinus to check for signs of infection.  View the inside of your sinuses using an imaging device that has a light attached (endoscope).  If your health care provider suspects that you have chronic sinusitis, you may also:  Be tested for allergies.  Have a sample of mucus taken from your nose (nasal culture) and checked for bacteria.  Have a mucus sample examined to see if your sinusitis is related to an allergy.  If your sinusitis does not respond to treatment and it lasts longer than 8 weeks, you may have an MRI or CT scan to check your sinuses. These  scans also help to determine how severe your infection is. In rare cases, a bone biopsy may be done to rule out more serious types of fungal sinus disease. How is this treated? Treatment for sinusitis depends on the cause and whether your condition is chronic or acute. If a virus is causing your sinusitis, your symptoms will go away on their own within 10 days. You may be given medicines to relieve your symptoms, including:  Topical nasal decongestants.  They shrink swollen nasal passages and let mucus drain from your sinuses.  Antihistamines. These drugs block inflammation that is triggered by allergies. This can help to ease swelling in your nose and sinuses.  Topical nasal corticosteroids. These are nasal sprays that ease inflammation and swelling in your nose and sinuses.  Nasal saline washes. These rinses can help to get rid of thick mucus in your nose.  If your condition is caused by bacteria, you will be given an antibiotic medicine. If your condition is caused by a fungus, you will be given an antifungal medicine. Surgery may be needed to correct underlying conditions, such as narrow nasal passages. Surgery may also be needed to remove polyps. Follow these instructions at home: Medicines  Take, use, or apply over-the-counter and prescription medicines only as told by your health care provider. These may include nasal sprays.  If you were prescribed an antibiotic medicine, take it as told by your health care provider. Do not stop taking the antibiotic even if you start to feel better. Hydrate and Humidify  Drink enough water to keep your urine clear or pale yellow. Staying hydrated will help to thin your mucus.  Use a cool mist humidifier to keep the humidity level in your home above 50%.  Inhale steam for 10-15 minutes, 3-4 times a day or as told by your health care provider. You can do this in the bathroom while a hot shower is running.  Limit your exposure to cool or dry air. Rest  Rest as much as possible.  Sleep with your head raised (elevated).  Make sure to get enough sleep each night. General instructions  Apply a warm, moist washcloth to your face 3-4 times a day or as told by your health care provider. This will help with discomfort.  Wash your hands often with soap and water to reduce your exposure to viruses and other germs. If soap and water are not available, use hand sanitizer.  Do not smoke. Avoid being  around people who are smoking (secondhand smoke).  Keep all follow-up visits as told by your health care provider. This is important. Contact a health care provider if:  You have a fever.  Your symptoms get worse.  Your symptoms do not improve within 10 days. Get help right away if:  You have a severe headache.  You have persistent vomiting.  You have pain or swelling around your face or eyes.  You have vision problems.  You develop confusion.  Your neck is stiff.  You have trouble breathing. This information is not intended to replace advice given to you by your health care provider. Make sure you discuss any questions you have with your health care provider. Document Released: 10/15/2005 Document Revised: 06/10/2016 Document Reviewed: 08/10/2015 Elsevier Interactive Patient Education  2018 [employer].    IF you received an x-ray today, you will receive an invoice from [facility]. Please contact [facility] at [phone] with questions or concerns regarding your invoice.   IF you received labwork today,  you will receive an invoice from [place]. Please contact [facility] at [phone] with questions or concerns regarding your invoice.   Our billing staff will not be able to assist you with questions regarding bills from these companies.  You will be contacted with the lab results as soon as they are available. The fastest way to get your results is to activate your My Chart account. Instructions are located on the last page of this paperwork. If you have not heard from [place] regarding the results in 2 weeks, please contact this office.

## 2019-06-01 ENCOUNTER — Ambulatory Visit: Payer: BLUE CROSS/BLUE SHIELD | Admitting: Family Medicine

## 2019-11-05 ENCOUNTER — Other Ambulatory Visit: Payer: Self-pay | Admitting: Emergency Medicine

## 2019-11-05 ENCOUNTER — Other Ambulatory Visit: Payer: Self-pay

## 2019-11-05 ENCOUNTER — Ambulatory Visit (INDEPENDENT_AMBULATORY_CARE_PROVIDER_SITE_OTHER): Payer: BC Managed Care – PPO | Admitting: Family Medicine

## 2019-11-05 DIAGNOSIS — Z13220 Encounter for screening for lipoid disorders: Secondary | ICD-10-CM

## 2019-11-05 DIAGNOSIS — Z0000 Encounter for general adult medical examination without abnormal findings: Secondary | ICD-10-CM

## 2019-11-06 ENCOUNTER — Ambulatory Visit (INDEPENDENT_AMBULATORY_CARE_PROVIDER_SITE_OTHER): Payer: BC Managed Care – PPO | Admitting: Family Medicine

## 2019-11-06 VITALS — BP 108/71 | HR 80 | Temp 98.1°F | Wt 139.2 lb

## 2019-11-06 DIAGNOSIS — Z23 Encounter for immunization: Secondary | ICD-10-CM | POA: Diagnosis not present

## 2019-11-06 DIAGNOSIS — L409 Psoriasis, unspecified: Secondary | ICD-10-CM | POA: Diagnosis not present

## 2019-11-06 DIAGNOSIS — Z0001 Encounter for general adult medical examination with abnormal findings: Secondary | ICD-10-CM

## 2019-11-06 DIAGNOSIS — R1013 Epigastric pain: Secondary | ICD-10-CM

## 2019-11-06 DIAGNOSIS — R102 Pelvic and perineal pain: Secondary | ICD-10-CM | POA: Diagnosis not present

## 2019-11-06 DIAGNOSIS — R0989 Other specified symptoms and signs involving the circulatory and respiratory systems: Secondary | ICD-10-CM | POA: Diagnosis not present

## 2019-11-06 DIAGNOSIS — Z0000 Encounter for general adult medical examination without abnormal findings: Secondary | ICD-10-CM

## 2019-11-06 LAB — POCT URINALYSIS DIP (MANUAL ENTRY)
Bilirubin, UA: NEGATIVE
Blood, UA: NEGATIVE
Glucose, UA: NEGATIVE mg/dL
Ketones, POC UA: NEGATIVE mg/dL
Leukocytes, UA: NEGATIVE
Nitrite, UA: NEGATIVE
Protein Ur, POC: NEGATIVE mg/dL
Spec Grav, UA: >=1.03 — AB (ref 1.010–1.025)
Urobilinogen, UA: 0.2 E.U./dL
pH, UA: 6.5 (ref 5.0–8.0)

## 2019-11-06 LAB — COMPREHENSIVE METABOLIC PANEL
ALT: 23 IU/L (ref 0–32)
AST: 19 IU/L (ref 0–40)
Albumin/Globulin Ratio: 2.2 (ref 1.2–2.2)
Albumin: 4.6 g/dL (ref 3.8–4.8)
Alkaline Phosphatase: 55 IU/L (ref 39–117)
BUN/Creatinine Ratio: 10 (ref 9–23)
BUN: 7 mg/dL (ref 6–20)
Bilirubin Total: 0.2 mg/dL (ref 0.0–1.2)
CO2: 23 mmol/L (ref 20–29)
Calcium: 9.5 mg/dL (ref 8.7–10.2)
Chloride: 104 mmol/L (ref 96–106)
Creatinine, Ser: 0.72 mg/dL (ref 0.57–1.00)
GFR calc Af Amer: 128 mL/min/{1.73_m2} (ref >59)
GFR calc non Af Amer: 111 mL/min/{1.73_m2} (ref >59)
Globulin, Total: 2.1 g/dL (ref 1.5–4.5)
Glucose: 88 mg/dL (ref 65–99)
Potassium: 3.9 mmol/L (ref 3.5–5.2)
Sodium: 141 mmol/L (ref 134–144)
Total Protein: 6.7 g/dL (ref 6.0–8.5)

## 2019-11-06 LAB — LIPID PANEL
Chol/HDL Ratio: 3.4 ratio (ref 0.0–4.4)
Cholesterol, Total: 238 mg/dL — ABNORMAL HIGH (ref 100–199)
HDL: 71 mg/dL (ref >39)
LDL Chol Calc (NIH): 143 mg/dL — ABNORMAL HIGH (ref 0–99)
Triglycerides: 136 mg/dL (ref 0–149)
VLDL Cholesterol Cal: 24 mg/dL (ref 5–40)

## 2019-11-06 LAB — POC MICROSCOPIC URINALYSIS (UMFC): Mucus: ABSENT

## 2019-11-06 MED ORDER — CLOBETASOL PROPIONATE 0.05 % EX OINT: 1.0000 "application " | TOPICAL_OINTMENT | Freq: Two times a day (BID) | CUTANEOUS | 2 refills | Status: DC

## 2019-11-06 NOTE — Progress Notes (Signed)
Subjective:  Patient ID: Carly Gordon, female    DOB: 1986/04/26  Age: 34 y.o. MRN: [id number]  CC:  Chief Complaint  Patient presents with  . Annual Exam    need refill on psorasis med and also may need to be referred to [facility] for acid reflex    HPI [name] presents for  Physical and concerns above.   Lab visit yesterday.   Hyperlipidemia: Noted on labs yesterday, no meds.  Lab Results  Component Value Date   CHOL 238 (H) 11/05/2019   HDL 71 11/05/2019   LDLCALC 143 (H) 11/05/2019   TRIG 136 11/05/2019   CHOLHDL 3.4 11/05/2019   Lab Results  Component Value Date   ALT 23 11/05/2019   AST 19 11/05/2019   ALKPHOS 55 11/05/2019   BILITOT 0.2 11/05/2019   Globus sensation Hepatic steatosis with history of cholelithiasis, noted on ultrasound August 2016. No surgery. No known history of cholecystitis.  Past month, after eating - notes feeling like food in stuck in throat, does not vomit food. Not every time.  Upper gastric pain in the middle of the night at times. Nausea, no vomiting.  No fever. Pain in stomach separate from globus sensation in throat. Both intermittent.  No blood in stool.  No dark stool. Normal BM's.  No urinary symptoms.  1 caffeinated beverage per day.  Heartburn when pregnant only - not recently.  Lab Results  Component Value Date   ALT 23 11/05/2019   AST 19 11/05/2019   ALKPHOS 55 11/05/2019   BILITOT 0.2 11/05/2019   Psoriasis: Affects fingers, and ankles. Requests clobetasol refill.  Uses clobetasol intermittently - every 2-3 days to fingers, nightly to ankles.   Cancer screening: Cervical: plans on scheduling with gynecology. Last appt with gyn in 2019. Unsure if pap performed.   Immunization History  Administered Date(s) Administered  . Influenza,inj,Quad PF,6+ Mos 11/06/2019  . Tdap 02/26/2018    Flu vaccine today.  Tetanus - 2015, when pregnant.   Depression screen [facility] 2/9 11/06/2019 06/05/2018  02/25/2017 08/31/2016 07/27/2016  Decreased Interest 0 0 0 0 0  Down, Depressed, Hopeless 0 0 0 0 0  PHQ - 2 Score 0 0 0 0 0    Hearing Screening   125Hz  250Hz  500Hz  1000Hz  2000Hz  3000Hz  4000Hz  6000Hz  8000Hz   Right ear:           Left ear:             Visual Acuity Screening   Right eye Left eye Both eyes  Without correction: 20/13 0 20/20  With correction:     optho - 2-3 months ago. 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   sti screening: Married, monogamous. Declines testing.   Dental: No regular dental care. Did have dental checkup in in 2019. Planning on vacation in 2021 - will be having checkup there.   Exercise: walking prior. Not during winter.    History Patient Active Problem List   Diagnosis Date Noted  . Cholelithiasis 06/27/2015  . Hepatic steatosis 06/27/2015  . Postpartum care following cesarean delivery (6/12) 04/09/2014  . Cesarean delivery delivered 04/09/2014   Past Medical History:  Diagnosis Date  . Anemia   . Postpartum care following cesarean delivery (6/12) 04/09/2014  . Psoriasis    legs   Past Surgical History:  Procedure Laterality Date  . CESAREAN SECTION  02/2012   x 1 [place]  . CESAREAN SECTION N/A 04/09/2014   Procedure: Repeat CESAREAN  SECTION;  Surgeon: Robley Fries, MD;  Location: [facility];  Service: Obstetrics;  Laterality: N/A;  EDD: 04/06/14   No Known Allergies Prior to Admission medications   Medication Sig Start Date End Date Taking? Authorizing Provider  clobetasol ointment (TEMOVATE) 0.05 % Apply 1 application topically 2 (two) times daily.   Yes [provider]   Social History   Socioeconomic History  . Marital status: Married    Spouse name: Not on file  . Number of children: Not on file  . Years of education: Not on file  . Highest education level: Not on file  Occupational History  . Not on file  Tobacco Use  . Smoking status: Never Smoker  . Smokeless tobacco: Never Used  Substance and Sexual  Activity  . Alcohol use: No  . Drug use: No  . Sexual activity: Yes    Birth control/protection: None    Comment: pregnant  Other Topics Concern  . Not on file  Social History Narrative  . Not on file   Social Determinants of Health   Financial Resource Strain:   . Difficulty of Paying Living Expenses: Not on file  Food Insecurity:   . Worried About Programme researcher, broadcasting/film/video in the Last Year: Not on file  . Ran Out of Food in the Last Year: Not on file  Transportation Needs:   . Lack of Transportation (Medical): Not on file  . Lack of Transportation (Non-Medical): Not on file  Physical Activity:   . Days of Exercise per Week: Not on file  . Minutes of Exercise per Session: Not on file  Stress:   . Feeling of Stress : Not on file  Social Connections:   . Frequency of Communication with Friends and Family: Not on file  . Frequency of Social Gatherings with Friends and Family: Not on file  . Attends Religious Services: Not on file  . Active Member of Clubs or Organizations: Not on file  . Attends Banker Meetings: Not on file  . Marital Status: Not on file  Intimate Partner Violence:   . Fear of Current or Ex-Partner: Not on file  . Emotionally Abused: Not on file  . Physically Abused: Not on file  . Sexually Abused: Not on file    Review of Systems 13 point review of systems per patient health survey noted.  Negative other than as indicated above or in HPI  Objective:   Vitals:   11/06/19 1458  BP: 108/71  Pulse: 80  Temp: 98.1 F (36.7 C)  TempSrc: Temporal  SpO2: 99%  Weight: 139 lb 3.2 oz (63.1 kg)     Physical Exam Vitals reviewed.  Constitutional:      Appearance: She is well-developed.  HENT:     Head: Normocephalic and atraumatic.     Right Ear: External ear normal.     Left Ear: External ear normal.  Eyes:     Conjunctiva/sclera: Conjunctivae normal.     Pupils: Pupils are equal, round, and reactive to light.  Neck:     Thyroid: No  thyromegaly.  Cardiovascular:     Rate and Rhythm: Normal rate and regular rhythm.     Heart sounds: Normal heart sounds. No murmur.  Pulmonary:     Effort: Pulmonary effort is normal. No respiratory distress.     Breath sounds: Normal breath sounds. No wheezing.  Abdominal:     General: Bowel sounds are normal.     Palpations: Abdomen is soft.  Tenderness: There is abdominal tenderness in the suprapubic area. There is no right CVA tenderness, left CVA tenderness, guarding or rebound. Negative signs include Murphy's sign and McBurney's sign.     Comments: Min suprapubic ttp.   Musculoskeletal:        General: No tenderness. Normal range of motion.     Cervical back: Normal range of motion and neck supple.  Lymphadenopathy:     Cervical: No cervical adenopathy.  Skin:    General: Skin is warm and dry.     Findings: No rash.     Comments: Dry scale on dorsal PIP of few fingers R hand, bilateral ankles.   Neurological:     Mental Status: She is alert and oriented to person, place, and time.  Psychiatric:        Behavior: Behavior normal.        Thought Content: Thought content normal.    Results for orders placed or performed in visit on 11/06/19  POCT urinalysis dipstick  Result Value Ref Range   Color, UA yellow yellow   Clarity, UA clear clear   Glucose, UA negative negative mg/dL   Bilirubin, UA negative negative   Ketones, POC UA negative negative mg/dL   Spec Grav, UA >=3.664 (A) 1.010 - 1.025   Blood, UA negative negative   pH, UA 6.5 5.0 - 8.0   Protein Ur, POC negative negative mg/dL   Urobilinogen, UA 0.2 0.2 or 1.0 E.U./dL   Nitrite, UA Negative Negative   Leukocytes, UA Negative Negative  POCT Microscopic Urinalysis ([facility])  Result Value Ref Range   WBC,UR,HPF,POC None None WBC/hpf   RBC,UR,HPF,POC None None RBC/hpf   Bacteria Moderate (A) None, Too numerous to count   Mucus Absent Absent   Epithelial Cells, UR Per Microscopy Moderate (A) None, Too numerous  to count cells/hpf     Assessment & Plan:  Koreen Lizaola is a 34 y.o. female . Annual physical exam  - -anticipatory guidance as below in AVS, screening labs above. Health maintenance items as above in HPI discussed/recommended as applicable.   Need for prophylactic vaccination and inoculation against influenza - Plan: Flu Vaccine QUAD 6+ mos PF IM (Fluarix Quad PF)  Epigastric pain - Plan: Ambulatory referral to Gastroenterology Globus sensation - Plan: Ambulatory referral to Gastroenterology  -Differential includes achalasia, GERD, esophagitis, with gastritis although epigastric abdomen nontender at present.  -Start omeprazole, refer to gastroenterology for possible endoscopy.  ER/RTC precautions.  Suprapubic abdominal pain - Plan: POCT urinalysis dipstick, POCT Microscopic Urinalysis ([facility])  -Reassuring urinalysis, afebrile, RTC precautions if persistent lower abdominal pain or worsening.  Psoriasis - Plan: clobetasol ointment (TEMOVATE) 0.05 %  -Refill clobetasol, Eucerin, and Aveeno as option for dry skin.  Consider dermatology eval if persistent/worsening.  Meds ordered this encounter  Medications  . clobetasol ointment (TEMOVATE) 0.05 %    Sig: Apply 1 application topically 2 (two) times daily.    Dispense:  30 g    Refill:  2   Patient Instructions   Cholesterol mildly elevated. exercise 150 minutes total per week (walking is fine) and recheck levels in 6 months to a year.   Start omeprazole once per day and I will refer you to gastroenterologist for gastric symptoms. Return to the clinic or go to the nearest emergency room if any of your symptoms worsen or new symptoms occur.  Clobetasol refilled for psoriasis. Eucerin can also help with dry skin.  Here are your labs from yesterday.  Results for orders  placed or performed in visit on 11/05/19  Lipid Panel  Result Value Ref Range   Cholesterol, Total 238 (H) 100 - 199 mg/dL   Triglycerides 136 0 - 149  mg/dL   HDL 71 >39 mg/dL   VLDL Cholesterol Cal 24 5 - 40 mg/dL   LDL Chol Calc (NIH) 143 (H) 0 - 99 mg/dL   Chol/HDL Ratio 3.4 0.0 - 4.4 ratio  Comprehensive metabolic panel  Result Value Ref Range   Glucose 88 65 - 99 mg/dL   BUN 7 6 - 20 mg/dL   Creatinine, Ser 0.72 0.57 - 1.00 mg/dL   GFR calc non Af Amer 111 >59 mL/min/1.73   GFR calc Af Amer 128 >59 mL/min/1.73   BUN/Creatinine Ratio 10 9 - 23   Sodium 141 134 - 144 mmol/L   Potassium 3.9 3.5 - 5.2 mmol/L   Chloride 104 96 - 106 mmol/L   CO2 23 20 - 29 mmol/L   Calcium 9.5 8.7 - 10.2 mg/dL   Total Protein 6.7 6.0 - 8.5 g/dL   Albumin 4.6 3.8 - 4.8 g/dL   Globulin, Total 2.1 1.5 - 4.5 g/dL   Albumin/Globulin Ratio 2.2 1.2 - 2.2   Bilirubin Total 0.2 0.0 - 1.2 mg/dL   Alkaline Phosphatase 55 39 - 117 IU/L   AST 19 0 - 40 IU/L   ALT 23 0 - 32 IU/L    Keeping You Healthy  Get These Tests 1. Blood Pressure- Have your blood pressure checked once a year by your health care provider.  Normal blood pressure is 120/80. 2. Weight- Have your body mass index (BMI) calculated to screen for obesity.  BMI is measure of body fat based on height and weight.  You can also calculate your own BMI at GravelBags.it. 3. Cholesterol- Have your cholesterol checked every 5 years starting at age 11 then yearly starting at age 52. 67. Chlamydia, HIV, and other sexually transmitted diseases- Get screened every year until age 56, then within three months of each new sexual provider. 5. Pap Test - Every 1-5 years; discuss with your health care provider. 6. Mammogram- Every 1-2 years starting at age 54--50  Take these medicines  Calcium with Vitamin D-Your body needs 1200 mg of Calcium each day and [phone] IU of Vitamin D daily.  Your body can only absorb 500 mg of Calcium at a time so Calcium must be taken in 2 or 3 divided doses throughout the day.  Multivitamin with folic acid- Once daily if it is possible for you to become  pregnant.  Get these Immunizations  Gardasil-Series of three doses; prevents HPV related illness such as genital warts and cervical cancer.  Menactra-Single dose; prevents meningitis.  Tetanus shot- Every 10 years.  Flu shot-Every year.  Take these steps 1. Do not smoke-Your healthcare provider can help you quit.  For tips on how to quit go to www.smokefree.gov or call 1-800 QUITNOW. 2. Be physically active- Exercise 5 days a week for at least 30 minutes.  If you are not already physically active, start slow and gradually work up to 30 minutes of moderate physical activity.  Examples of moderate activity include walking briskly, dancing, swimming, bicycling, etc. 3. Breast Cancer- A self breast exam every month is important for early detection of breast cancer.  For more information and instruction on self breast exams, ask your healthcare provider or https://www.patel.info/. 4. Eat a healthy diet- Eat a variety of healthy foods such as fruits, vegetables, whole grains,  low fat milk, low fat cheeses, yogurt, lean meats, poultry and fish, beans, nuts, tofu, etc.  For more information go to www. Thenutritionsource.org 5. Drink alcohol in moderation- Limit alcohol intake to one drink or less per day. Never drink and drive. 6. Depression- Your emotional health is as important as your physical health.  If you're feeling down or losing interest in things you normally enjoy please talk to your healthcare provider about being screened for depression. 7. Dental visit- Brush and floss your teeth twice daily; visit your dentist twice a year. 8. Eye doctor- Get an eye exam at least every 2 years. 9. Helmet use- Always wear a helmet when riding a bicycle, motorcycle, rollerblading or skateboarding. 10. Safe sex- If you may be exposed to sexually transmitted infections, use a condom. 11. Seat belts- Seat belts can save your live; always wear one. 12. Smoke/Carbon Monoxide detectors-  These detectors need to be installed on the appropriate level of your home. Replace batteries at least once a year. 13. Skin cancer- When out in the sun please cover up and use sunscreen 15 SPF or higher. 14. Violence- If anyone is threatening or hurting you, please tell your healthcare provider.         If you have lab work done today you will be contacted with your lab results within the next 2 weeks.  If you have not heard from us then please contact us. The fastest way to get your results is to register for My Chart.   IF you received an x-ray today, you will receive an invoice from [facility]. Please contact [facility] at [phone] with questions or concerns regarding your invoice.   IF you received labwork today, you will receive an invoice from [facility]. Please contact [facility] at [phone] with questions or concerns regarding your invoice.   Our billing staff will not be able to assist you with questions regarding bills from these companies.  You will be contacted with the lab results as soon as they are available. The fastest way to get your results is to activate your My Chart account. Instructions are located on the last page of this paperwork. If you have not heard from us regarding the results in 2 weeks, please contact this office.          Signed, Meredith StaggersJeffrey Violeta Lecount, MD [facility]

## 2019-11-06 NOTE — Patient Instructions (Addendum)
Cholesterol mildly elevated. exercise 150 minutes total per week (walking is fine) and recheck levels in 6 months to a year.   Start omeprazole once per day and I will refer you to gastroenterologist for gastric symptoms. Return to the clinic or go to the nearest emergency room if any of your symptoms worsen or new symptoms occur.  Clobetasol refilled for psoriasis. Eucerin can also help with dry skin.  Here are your labs from yesterday.  Results for orders placed or performed in visit on 11/05/19  Lipid Panel  Result Value Ref Range   Cholesterol, Total 238 (H) 100 - 199 mg/dL   Triglycerides 250 0 - 149 mg/dL   HDL 71 >53 mg/dL   VLDL Cholesterol Cal 24 5 - 40 mg/dL   LDL Chol Calc (NIH) 976 (H) 0 - 99 mg/dL   Chol/HDL Ratio 3.4 0.0 - 4.4 ratio  Comprehensive metabolic panel  Result Value Ref Range   Glucose 88 65 - 99 mg/dL   BUN 7 6 - 20 mg/dL   Creatinine, Ser 7.34 0.57 - 1.00 mg/dL   GFR calc non Af Amer 111 >59 mL/min/1.73   GFR calc Af Amer 128 >59 mL/min/1.73   BUN/Creatinine Ratio 10 9 - 23   Sodium 141 134 - 144 mmol/L   Potassium 3.9 3.5 - 5.2 mmol/L   Chloride 104 96 - 106 mmol/L   CO2 23 20 - 29 mmol/L   Calcium 9.5 8.7 - 10.2 mg/dL   Total Protein 6.7 6.0 - 8.5 g/dL   Albumin 4.6 3.8 - 4.8 g/dL   Globulin, Total 2.1 1.5 - 4.5 g/dL   Albumin/Globulin Ratio 2.2 1.2 - 2.2   Bilirubin Total 0.2 0.0 - 1.2 mg/dL   Alkaline Phosphatase 55 39 - 117 IU/L   AST 19 0 - 40 IU/L   ALT 23 0 - 32 IU/L    Keeping You Healthy  Get These Tests 1. Blood Pressure- Have your blood pressure checked once a year by your health care provider.  Normal blood pressure is 120/80. 2. Weight- Have your body mass index (BMI) calculated to screen for obesity.  BMI is measure of body fat based on height and weight.  You can also calculate your own BMI at https://www.west-esparza.com/. 3. Cholesterol- Have your cholesterol checked every 5 years starting at age 53 then yearly starting at age  39. 4. Chlamydia, HIV, and other sexually transmitted diseases- Get screened every year until age 30, then within three months of each new sexual provider. 5. Pap Test - Every 1-5 years; discuss with your health care provider. 6. Mammogram- Every 1-2 years starting at age 37--50  Take these medicines  Calcium with Vitamin D-Your body needs 1200 mg of Calcium each day and [phone] IU of Vitamin D daily.  Your body can only absorb 500 mg of Calcium at a time so Calcium must be taken in 2 or 3 divided doses throughout the day.  Multivitamin with folic acid- Once daily if it is possible for you to become pregnant.  Get these Immunizations  Gardasil-Series of three doses; prevents HPV related illness such as genital warts and cervical cancer.  Menactra-Single dose; prevents meningitis.  Tetanus shot- Every 10 years.  Flu shot-Every year.  Take these steps 1. Do not smoke-Your healthcare provider can help you quit.  For tips on how to quit go to www.smokefree.gov or call 1-800 QUITNOW. 2. Be physically active- Exercise 5 days a week for at least 30 minutes.  If  you are not already physically active, start slow and gradually work up to 30 minutes of moderate physical activity.  Examples of moderate activity include walking briskly, dancing, swimming, bicycling, etc. 3. Breast Cancer- A self breast exam every month is important for early detection of breast cancer.  For more information and instruction on self breast exams, ask your healthcare provider or https://www.patel.info/. 4. Eat a healthy diet- Eat a variety of healthy foods such as fruits, vegetables, whole grains, low fat milk, low fat cheeses, yogurt, lean meats, poultry and fish, beans, nuts, tofu, etc.  For more information go to www. Thenutritionsource.org 5. Drink alcohol in moderation- Limit alcohol intake to one drink or less per day. Never drink and drive. 6. Depression- Your emotional health is as important  as your physical health.  If you're feeling down or losing interest in things you normally enjoy please talk to your healthcare provider about being screened for depression. 7. Dental visit- Brush and floss your teeth twice daily; visit your dentist twice a year. 8. Eye doctor- Get an eye exam at least every 2 years. 9. Helmet use- Always wear a helmet when riding a bicycle, motorcycle, rollerblading or skateboarding. 82. Safe sex- If you may be exposed to sexually transmitted infections, use a condom. 11. Seat belts- Seat belts can save your live; always wear one. 12. Smoke/Carbon Monoxide detectors- These detectors need to be installed on the appropriate level of your home. Replace batteries at least once a year. 13. Skin cancer- When out in the sun please cover up and use sunscreen 15 SPF or higher. 14. Violence- If anyone is threatening or hurting you, please tell your healthcare provider.         If you have lab work done today you will be contacted with your lab results within the next 2 weeks.  If you have not heard from [place] then please contact us. The fastest way to get your results is to register for My Chart.   IF you received an x-ray today, you will receive an invoice from [facility]. Please contact [facility] at [phone] with questions or concerns regarding your invoice.   IF you received labwork today, you will receive an invoice from [place]. Please contact [facility] at [phone] with questions or concerns regarding your invoice.   Our billing staff will not be able to assist you with questions regarding bills from these companies.  You will be contacted with the lab results as soon as they are available. The fastest way to get your results is to activate your My Chart account. Instructions are located on the last page of this paperwork. If you have not heard from [place] regarding the results in 2 weeks, please contact this office.

## 2019-11-07 MED ORDER — OMEPRAZOLE 20 MG PO CPDR: 20.0000 mg | DELAYED_RELEASE_CAPSULE | Freq: Every day | ORAL | 1 refills | Status: DC

## 2019-11-11 ENCOUNTER — Encounter: Payer: Self-pay | Admitting: Gastroenterology

## 2019-12-14 ENCOUNTER — Encounter: Payer: Self-pay | Admitting: Gastroenterology

## 2019-12-14 ENCOUNTER — Ambulatory Visit (INDEPENDENT_AMBULATORY_CARE_PROVIDER_SITE_OTHER): Payer: BC Managed Care – PPO | Admitting: Gastroenterology

## 2019-12-14 VITALS — BP 110/68 | HR 60 | Temp 97.9°F | Ht 62.5 in | Wt 140.0 lb

## 2019-12-14 DIAGNOSIS — Z8719 Personal history of other diseases of the digestive system: Secondary | ICD-10-CM

## 2019-12-14 DIAGNOSIS — R12 Heartburn: Secondary | ICD-10-CM | POA: Diagnosis not present

## 2019-12-14 DIAGNOSIS — R0989 Other specified symptoms and signs involving the circulatory and respiratory systems: Secondary | ICD-10-CM

## 2019-12-14 DIAGNOSIS — R1011 Right upper quadrant pain: Secondary | ICD-10-CM

## 2019-12-14 NOTE — Progress Notes (Signed)
Chief Complaint: Abdominal pain, dysphagia  Referring Provider:     Shade Flood, MD   HPI:    Carly Gordon is a 34 y.o. female with a history of Psoriasis, peripartum cholelithiasis, referred to the Gastroenterology Clinic for evaluation of abdominal pain.  Upper abdominal pain which occurs middle of the night/early AM.  RUQ most commonly, with occasional LUQ pain.  RUQ pain can radiate to right shoulder on occasion.  More common after eating spicy foods, potato, green beans. Pain lats 3-6 hours, without n/v. No hematochezia, melena, fever, chills. No meds trialed for these sxs.  Pain has largely abated with dietary modifications.  Similar symptoms in 2015 following 2nd child, was diagnosed with cholelithiasis, treated with dietary mods/weight loss.  RUQ [place] in 05/2015 with cholelithiasis and probable hepatic steatosis.  Separately, she c/o acid reflux sxs, characterized by increased belching, HB. Resolved after eliminating black coffee.  Interestingly, does okay when coffee has additives (sugar, milk, etc.).  Also worse with spicy foods.  Reflux symptoms largely controlled with dietary modifications.  +globus sensation, also resolved with dietary modifications.  Was seen by her PCM on 11/06/2019 with the same issues.  Prescribed omeprazole 20 mg/day, but she never started this.    Reviewed most recent labs from 10/2019: Normal CMP.  No recent abdominal imaging for review.  No previous EGD or colonoscopy.    Grandmother with Pancreatic CA (heavy tobacco use). Otherwise, no FHx of GI malignancy, CRC, IBD, liver disease.    Past Medical History:  Diagnosis Date  . Anemia   . Postpartum care following cesarean delivery (6/12) 04/09/2014  . Psoriasis    legs     Past Surgical History:  Procedure Laterality Date  . CESAREAN SECTION  02/2012   x 1 [place]  . CESAREAN SECTION N/A 04/09/2014   Procedure: Repeat CESAREAN SECTION;  Surgeon: Robley Fries, MD;   Location: [facility];  Service: Obstetrics;  Laterality: N/A;  EDD: 04/06/14   Family History  Problem Relation Age of Onset  . Diabetes Mother   . Diabetes Father   . Heart disease Father   . Pancreatic cancer Maternal Grandmother   . Colon cancer Neg Hx   . Stomach cancer Neg Hx   . Esophageal cancer Neg Hx    Social History   Tobacco Use  . Smoking status: Never Smoker  . Smokeless tobacco: Never Used  Substance Use Topics  . Alcohol use: No  . Drug use: No   Current Outpatient Medications  Medication Sig Dispense Refill  . clobetasol ointment (TEMOVATE) 0.05 % Apply 1 application topically 2 (two) times daily. 30 g 2  . omeprazole (PRILOSEC) 20 MG capsule Take 1 capsule (20 mg total) by mouth daily. 30 capsule 1   No current facility-administered medications for this visit.   No Known Allergies   Review of Systems: All systems reviewed and negative except where noted in HPI.     Physical Exam:    Wt Readings from Last 3 Encounters:  12/14/19 140 lb (63.5 kg)  11/06/19 139 lb 3.2 oz (63.1 kg)  11/05/19 137 lb (62.1 kg)    BP 110/68   Pulse 60   Temp 97.9 F (36.6 C)   Ht 5' 2.5" (1.588 m)   Wt 140 lb (63.5 kg)   BMI 25.20 kg/m  Constitutional:  Pleasant, in no acute distress. Psychiatric: Normal mood and affect. Behavior is normal. EENT:  Pupils normal.  Conjunctivae are normal. No scleral icterus. Neck supple. No cervical LAD. Cardiovascular: Normal rate, regular rhythm. No edema Pulmonary/chest: Effort normal and breath sounds normal. No wheezing, rales or rhonchi. Abdominal: Mild TTP in RUQ without rebound or guarding.  Negative Carnett's sign.  Negative Murphy sign.  Soft, nondistended. Bowel sounds active throughout. There are no masses palpable. No hepatomegaly. Neurological: Alert and oriented to person place and time. Skin: Skin is warm and dry. No rashes noted.   ASSESSMENT AND PLAN;   1) RUQ pain 2) History of cholelithiasis  -RUQ  ultrasound -If ultrasound largely unrevealing, can plan for EGD to evaluate for gastritis, duodenitis, PUD, H. pylori, etc. -If the above both unrevealing, plan for HIDA -Discussed trial of medical management, which she politely declined -Has had good clinical response to dietary modifications alone.  Encouraged to continue along with diet diary to identify any other triggers  3) Heartburn 4) Globus sensation -Reflux type symptoms essentially resolved with dietary modifications alone -No plan for reinstitution of medications given good clinical response  RTC in 3 months or sooner as needed   Lavena Bullion, DO, FACG  12/14/2019, 4:08 PM   Carly Agreste, MD

## 2019-12-14 NOTE — Patient Instructions (Signed)
If you are age 33 or older, your body mass index should be between 23-30. Your Body mass index is 25.2 kg/m. If this is out of the aforementioned range listed, please consider follow up with your Primary Care Provider.  If you are age 34 or younger, your body mass index should be between 19-25. Your Body mass index is 25.2 kg/m. If this is out of the aformentioned range listed, please consider follow up with your Primary Care Provider.   You have been scheduled for an abdominal ultrasound at [facility] (1st floor of hospital) on 12-18-2019 at 130pm. Please arrive 15 minutes prior to your appointment for registration. Make certain not to have anything to eat or drink 6 hours prior to your appointment. Should you need to reschedule your appointment, please contact radiology at [phone]. This test typically takes about 30 minutes to perform.;  Thank you for choosing me and [facility].  Dr. Barron Alvine

## 2019-12-18 ENCOUNTER — Other Ambulatory Visit (HOSPITAL_COMMUNITY): Payer: BC Managed Care – PPO

## 2019-12-23 ENCOUNTER — Ambulatory Visit (HOSPITAL_COMMUNITY)
Admission: RE | Admit: 2019-12-23 | Discharge: 2019-12-23 | Disposition: A | Discharge status: Home / Self Care | Payer: BC Managed Care – PPO | Source: Ambulatory Visit | Attending: Gastroenterology | Admitting: Gastroenterology

## 2019-12-23 ENCOUNTER — Other Ambulatory Visit: Payer: Self-pay

## 2019-12-23 DIAGNOSIS — R12 Heartburn: Secondary | ICD-10-CM

## 2019-12-23 DIAGNOSIS — R1011 Right upper quadrant pain: Secondary | ICD-10-CM | POA: Diagnosis present

## 2020-03-15 IMAGING — US US ABDOMEN LIMITED
1 series · 13 of 25 positions shown · non-contrast
Comparison: 06/27/2015

CLINICAL DATA: Right upper quadrant abdominal pain.

EXAM:
ULTRASOUND ABDOMEN LIMITED RIGHT UPPER QUADRANT

[Series 1: us abdomen limited · 13 of 42 slices shown]
[im 1/42]
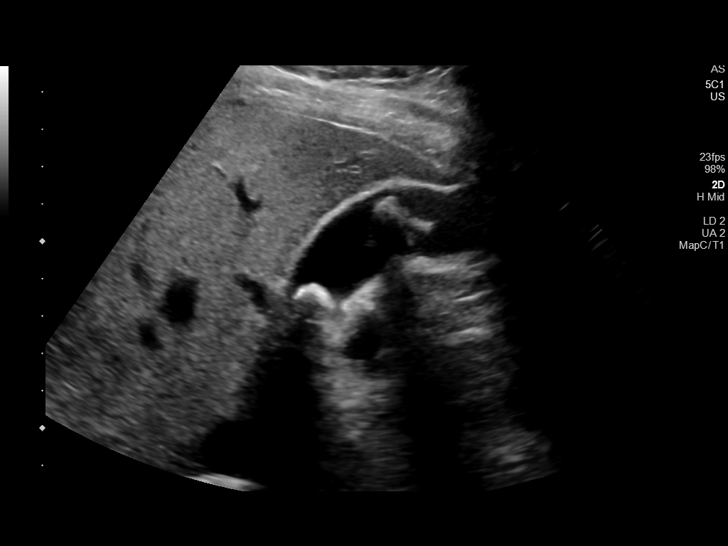
[im 4/42]
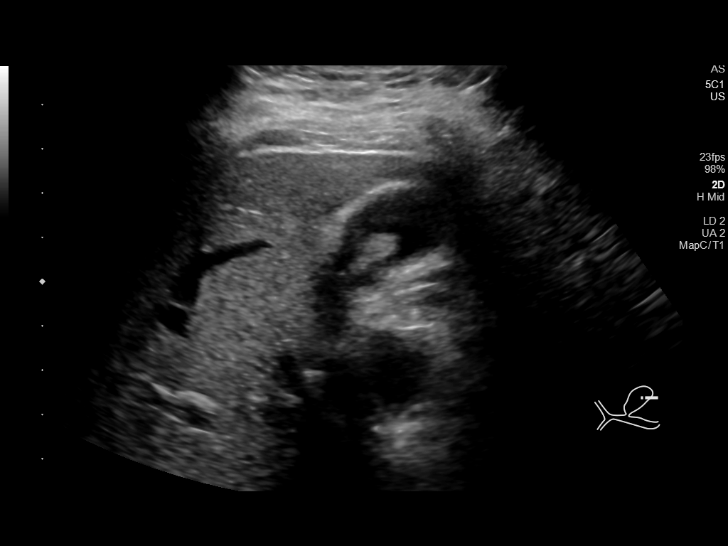
[im 7/42]
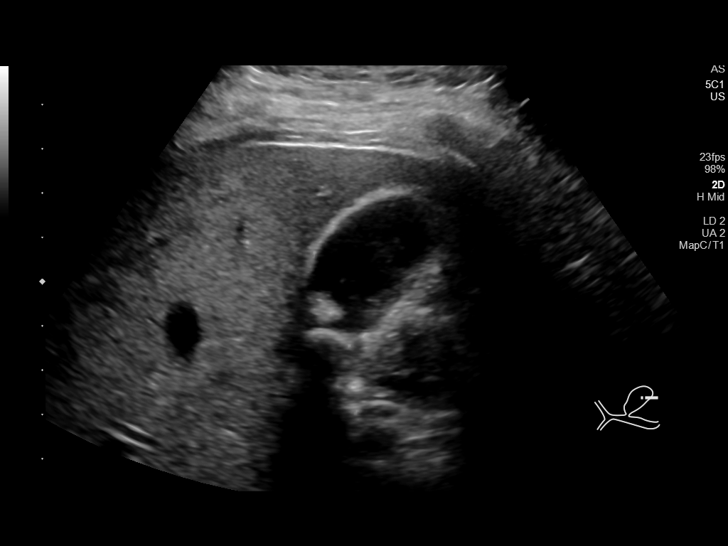
[im 11/42]
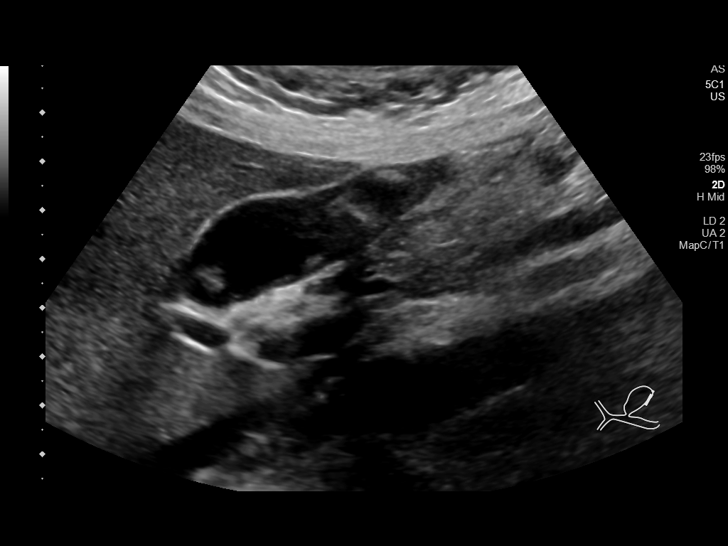
[im 14/42]
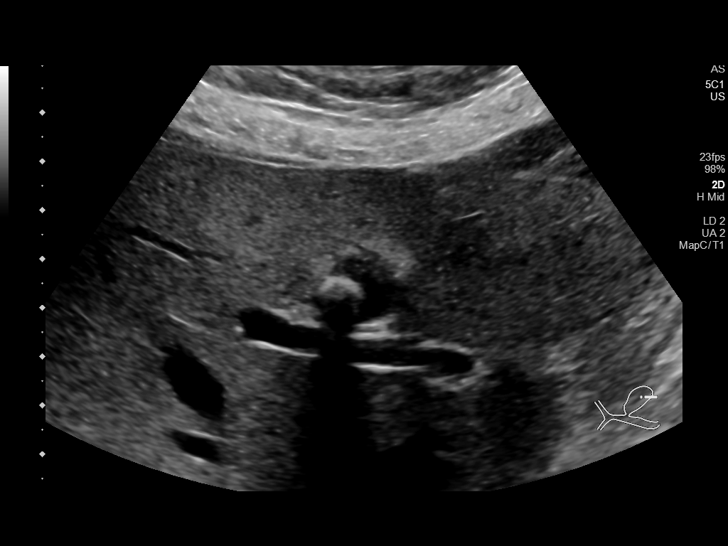
[im 18/42]
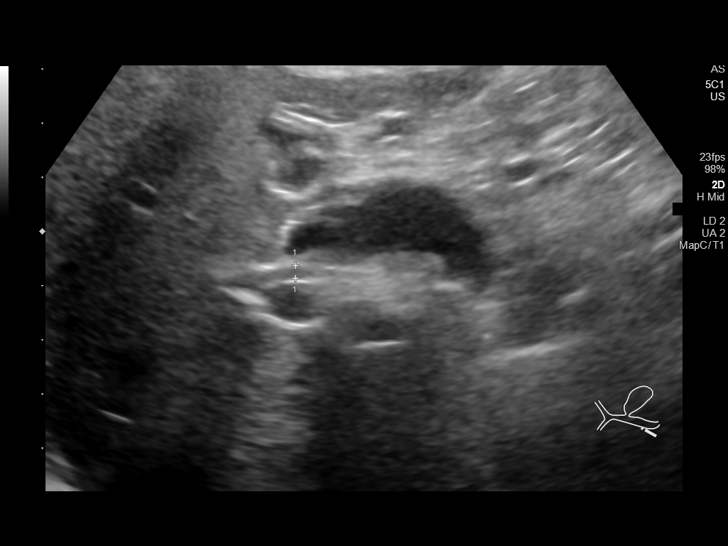
[im 21/42]
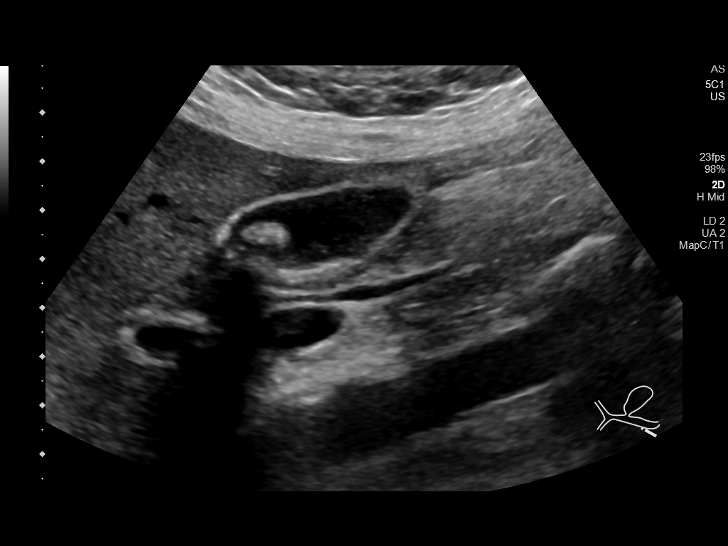
[im 24/42]
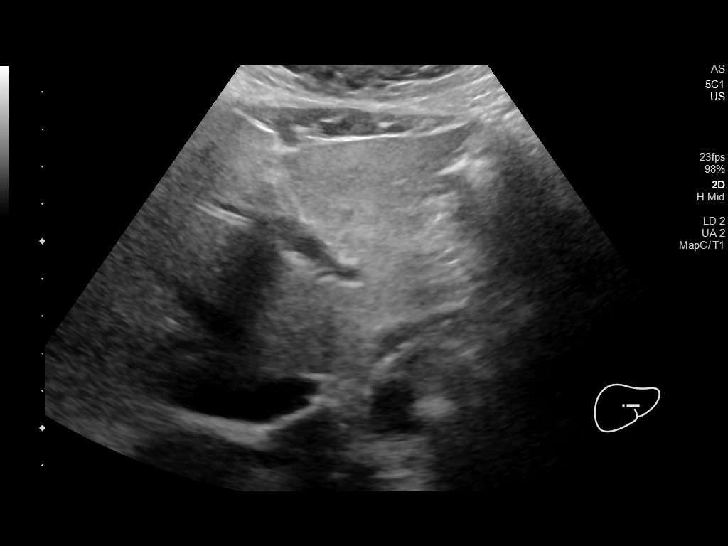
[im 28/42]
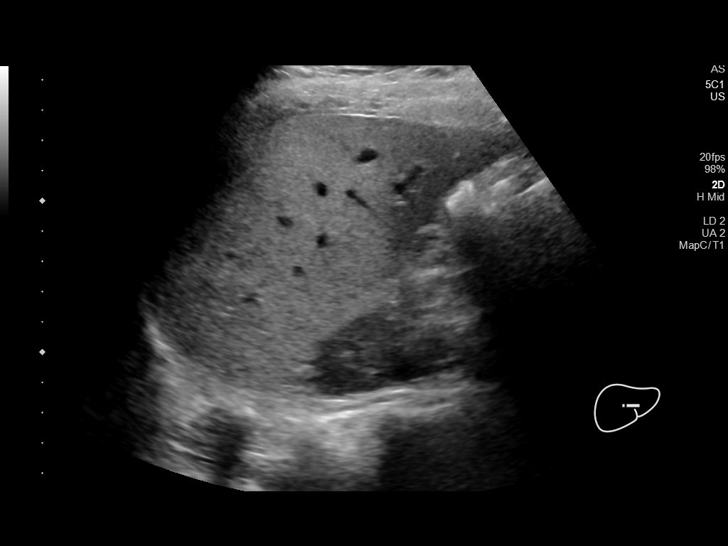
[im 31/42]
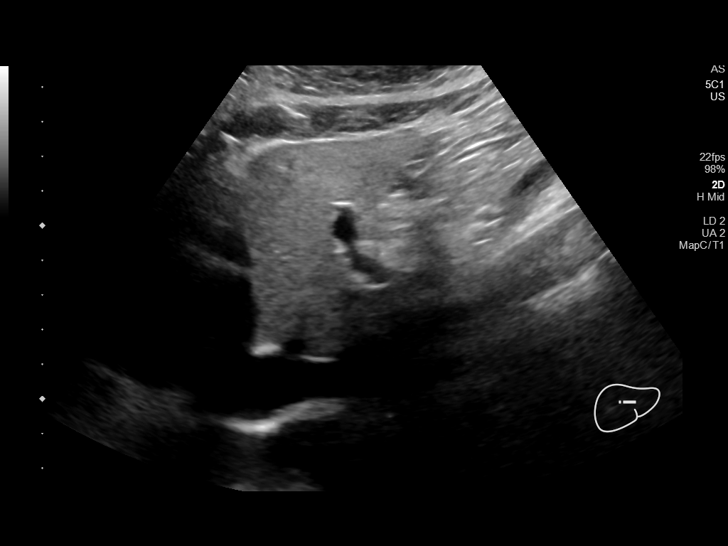
[im 35/42]
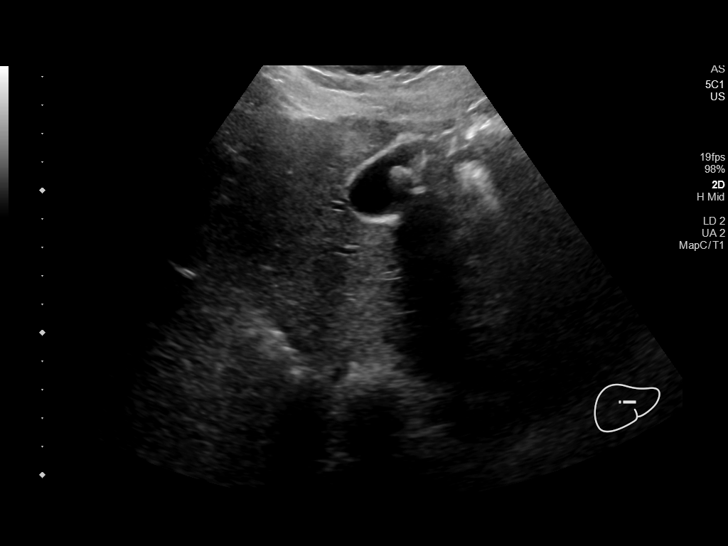
[im 38/42]
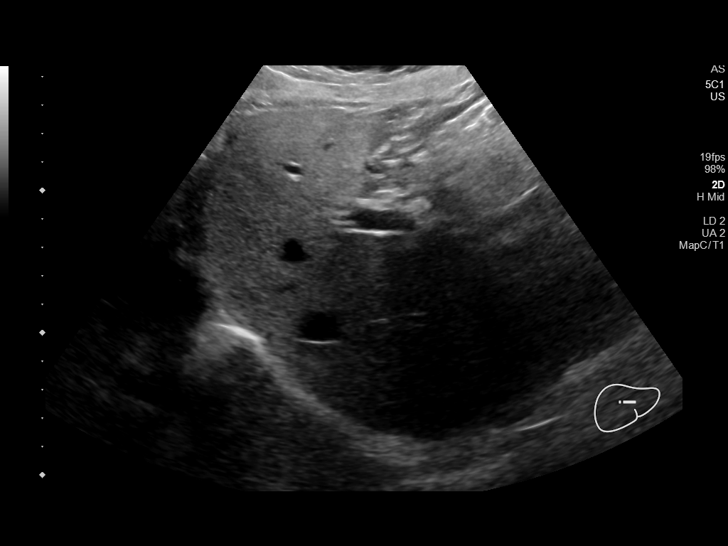
[im 42/42]
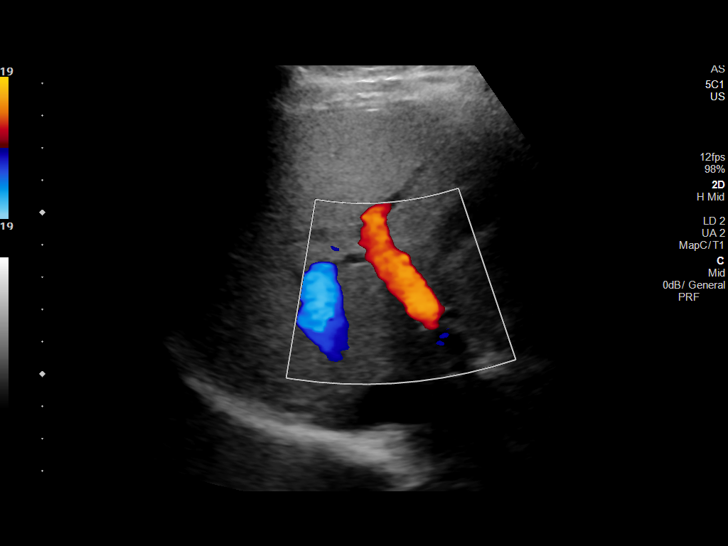

[13 of 25 positions shown; findings below may reference images not displayed]

FINDINGS: Gallbladder:

There are several echogenic partially shadowing stones seen within
an otherwise normal-appearing gallbladder. Dominant stone within the
neck of the gallbladder measures approximately 1.2 cm in diameter
(image 7). No definitive evidence of gallbladder wall thickening or
pericholecystic fluid. Negative sonographic Murphy sign.

Common bile duct:

Diameter: Normal in size measuring 4 mm in diameter.

Liver:

There is diffuse increased echogenicity of the hepatic parenchyma
(representative image 30), similar to the [DATE] examination and
suggestive of hepatic steatosis. No discrete hepatic lesions. No
intrahepatic biliary duct dilatation. No ascites. Portal vein is
patent on color Doppler imaging with normal direction of blood flow
towards the liver.

Other: None.
IMPRESSION: 1. Cholelithiasis without evidence of cholecystitis, similar to the
[DATE] examination. If there remains concern for biliary
dyskinesia, further evaluation with nuclear medicine HIDA scan with
CCK augmentation could be performed as indicated.
2. Suspected hepatic steatosis, similar to the [DATE] examination.
Correlation with LFTs could be performed as indicated.

## 2020-10-29 HISTORY — PX: CHOLECYSTECTOMY: SHX55

## 2022-03-01 ENCOUNTER — Encounter: Payer: Self-pay | Admitting: Family Medicine

## 2022-03-01 ENCOUNTER — Ambulatory Visit: Payer: 59 | Admitting: Family Medicine

## 2022-03-01 VITALS — BP 124/80 | HR 84 | Temp 98.4°F | Resp 16 | Ht 62.5 in | Wt 140.4 lb

## 2022-03-01 DIAGNOSIS — R509 Fever, unspecified: Secondary | ICD-10-CM | POA: Diagnosis not present

## 2022-03-01 DIAGNOSIS — Z20818 Contact with and (suspected) exposure to other bacterial communicable diseases: Secondary | ICD-10-CM | POA: Diagnosis not present

## 2022-03-01 LAB — POC COVID19 BINAXNOW: SARS Coronavirus 2 Ag: NEGATIVE

## 2022-03-01 MED ORDER — AMOXICILLIN 500 MG PO CAPS: 500.0000 mg | ORAL_CAPSULE | Freq: Two times a day (BID) | ORAL | 0 refills | Status: AC

## 2022-03-01 NOTE — Patient Instructions (Addendum)
Okay to continue Tylenol for sore throat, make sure to drink plenty of fluids.  We will let you know the results of your COVID test later today.  Antibiotic was sent to pharmacy for possible strep throat given the contact with strep throat.  I am glad to hear that you are little bit better.  If any increased neck swelling or any worsening symptoms please be seen here, urgent care or emergency room.  Take care.  ? ?Sore Throat ?A sore throat is pain, burning, irritation, or scratchiness in the throat. When you have a sore throat, you may feel pain or tenderness in your throat when you swallow or talk. ?Many things can cause a sore throat, including: ?An infection. ?Seasonal allergies. ?Dryness in the air. ?Irritants, such as smoke or pollution. ?Radiation treatment for cancer. ?Gastroesophageal reflux disease (GERD). ?A tumor. ?A sore throat is often the first sign of another sickness. It may happen with other symptoms, such as coughing, sneezing, fever, and swollen neck glands. Most sore throats go away without medical treatment. ?Follow these instructions at home: ? ?  ? ?Medicines ?Take over-the-counter and prescription medicines only as told by your health care provider. ?Children often get sore throats. Do not give your child aspirin because of the association with Reye's syndrome. ?Use throat sprays to soothe your throat as told by your health care provider. ?Managing pain ?To help with pain, try: ?Sipping warm liquids, such as broth, herbal tea, or warm water. ?Eating or drinking cold or frozen liquids, such as frozen ice pops. ?Gargling with a mixture of salt and water 3-4 times a day or as needed. To make salt water, completely dissolve ?-1 tsp (3-6 g) of salt in 1 cup (237 mL) of warm water. ?Sucking on hard candy or throat lozenges. ?Putting a cool-mist humidifier in your bedroom at night to moisten the air. ?Sitting in the bathroom with the door closed for 5-10 minutes while you run hot water in the  shower. ?General instructions ?Do not use any products that contain nicotine or tobacco. These products include cigarettes, chewing tobacco, and vaping devices, such as e-cigarettes. If you need help quitting, ask your health care provider. ?Rest as needed. ?Drink enough fluid to keep your urine pale yellow. ?Wash your hands often with soap and water for at least 20 seconds. If soap and water are not available, use hand sanitizer. ?Contact a health care provider if: ?You have a fever for more than 2-3 days. ?You have symptoms that last for more than 2-3 days. ?Your throat does not get better within 7 days. ?You have a fever and your symptoms suddenly get worse. ?Get help right away if: ?You have difficulty breathing. ?You cannot swallow fluids, soft foods, or your saliva. ?You have increased swelling in your throat or neck. ?You have persistent nausea and vomiting. ?These symptoms may represent a serious problem that is an emergency. Do not wait to see if the symptoms will go away. Get medical help right away. Call your local emergency services (911 in the [place] Do not drive yourself to the hospital. ?Summary ?A sore throat is pain, burning, irritation, or scratchiness in the throat. Many things can cause a sore throat. ?Take over-the-counter medicines only as told by your health care provider. ?Rest as needed. ?Drink enough fluid to keep your urine pale yellow. ?Contact a health care provider if your throat does not get better within 7 days. ?This information is not intended to replace advice given to you by your  health care provider. Make sure you discuss any questions you have with your health care provider. ?Document Revised: 01/11/2021 Document Reviewed: 01/11/2021 ?Elsevier Patient Education ? 2023 [employer] ? ?

## 2022-03-01 NOTE — Progress Notes (Signed)
? ?Subjective:  ?Patient ID: Carly Gordon, female    DOB: 1986/05/03  Age: 36 y.o. MRN: [id number] ? ?CC:  ?Chief Complaint  ?Patient presents with  ? Fever  ?  Pt had fever on and off since Monday, noted 99.8 apx, notes nasal and chest congestion, notes pain in her Lt jaw under her ear and into her throat notes a lot of pressure   ? ? ?HPI ?Carly Gordon presents for  ? ?Fever: ?Off-and-on past 4 days.  Low grade initially.  ?Recent trip to [place] with family. After return from trip - 2 children positive for strep.  ?Sore throat past 2 days, sore below left ear, feels puffy.  ?Swallowing liquids and solids ok. Some cough, no chest congestion, some nasal congestion.  ?No home covid test. ? ?Tx: tylenol. No fever in past 2 days. Steam bath. Warm compress helped to neck yeterday ? ? ?History ?Patient Active Problem List  ? Diagnosis Date Noted  ? Cholelithiasis 06/27/2015  ? Hepatic steatosis 06/27/2015  ? Postpartum care following cesarean delivery (6/12) 04/09/2014  ? Cesarean delivery delivered 04/09/2014  ? ?Past Medical History:  ?Diagnosis Date  ? Anemia   ? Postpartum care following cesarean delivery (6/12) 04/09/2014  ? Psoriasis   ? legs  ? ?Past Surgical History:  ?Procedure Laterality Date  ? CESAREAN SECTION  02/2012  ? x 1 [place]  ? CESAREAN SECTION N/A 04/09/2014  ? Procedure: Repeat CESAREAN SECTION;  Surgeon: Robley FriesVaishali R [name], MD;  Location: [facility];  Service: Obstetrics;  Laterality: N/A;  EDD: 04/06/14  ? ?No Known Allergies ?Prior to Admission medications   ?Medication Sig Start Date End Date Taking? Authorizing Provider  ?clobetasol ointment (TEMOVATE) 0.05 % Apply 1 application topically 2 (two) times daily. 11/06/19  Yes Shade FloodGreene, Aunesty Tyson R, MD  ?omeprazole (PRILOSEC) 20 MG capsule Take 1 capsule (20 mg total) by mouth daily. 11/07/19  Yes Shade FloodGreene, Sofiya Ezelle R, MD  ? ?Social History  ? ?Socioeconomic History  ? Marital status: Married  ?  Spouse name: Not on file  ? Number of children:  Not on file  ? Years of education: Not on file  ? Highest education level: Not on file  ?Occupational History  ? Not on file  ?Tobacco Use  ? Smoking status: Never  ? Smokeless tobacco: Never  ?Substance and Sexual Activity  ? Alcohol use: No  ? Drug use: No  ? Sexual activity: Yes  ?  Birth control/protection: None  ?  Comment: pregnant  ?Other Topics Concern  ? Not on file  ?Social History Narrative  ? Not on file  ? ?Social Determinants of Health  ? ?Financial Resource Strain: Not on file  ?Food Insecurity: Not on file  ?Transportation Needs: Not on file  ?Physical Activity: Not on file  ?Stress: Not on file  ?Social Connections: Not on file  ?Intimate Partner Violence: Not on file  ? ? ?Review of Systems ?Per HPI.  ? ? ?Objective:  ? ?Vitals:  ? 03/01/22 1209  ?BP: 124/80  ?Pulse: 84  ?Resp: 16  ?Temp: 98.4 ?F (36.9 ?C)  ?TempSrc: Temporal  ?SpO2: 98%  ?Weight: 140 lb 6.4 oz (63.7 kg)  ?Height: 5' 2.5" (1.588 m)  ? ? ? ?Physical Exam ?Vitals reviewed.  ?Constitutional:   ?   General: She is not in acute distress. ?   Appearance: She is well-developed.  ?HENT:  ?   Head: Normocephalic and atraumatic.  ?   Right Ear: Hearing, tympanic membrane, ear  canal and external ear normal.  ?   Left Ear: Hearing, tympanic membrane, ear canal and external ear normal.  ?   Nose: Nose normal.  ?   Mouth/Throat:  ?   Mouth: Mucous membranes are moist.  ?   Pharynx: Oropharynx is clear. No oropharyngeal exudate or posterior oropharyngeal erythema.  ?Eyes:  ?   Conjunctiva/sclera: Conjunctivae normal.  ?   Pupils: Pupils are equal, round, and reactive to light.  ?Neck:  ?   Comments: No appreciable neck swelling or focal lymphadenopathy.  Mild discomfort over the AC nodes and just below the left ear.  Mastoid nontender. ?Cardiovascular:  ?   Rate and Rhythm: Normal rate and regular rhythm.  ?   Heart sounds: Normal heart sounds. No murmur heard. ?Pulmonary:  ?   Effort: Pulmonary effort is normal. No respiratory distress.  ?    Breath sounds: Normal breath sounds. No wheezing or rhonchi.  ?Skin: ?   General: Skin is warm and dry.  ?   Findings: No rash.  ?Neurological:  ?   Mental Status: She is alert and oriented to person, place, and time.  ?Psychiatric:     ?   Mood and Affect: Mood normal.     ?   Behavior: Behavior normal.  ? ?Results for orders placed or performed in visit on 03/01/22  ?POC COVID-19 BinaxNow  ?Result Value Ref Range  ? SARS Coronavirus 2 Ag Negative Negative  ? ? ? ? ?Assessment & Plan:  ?Carly Gordon is a 36 y.o. female . ?Fever, unspecified fever cause - Plan: amoxicillin (AMOXIL) 500 MG capsule, POC COVID-19 BinaxNow ? ?Exposure to strep throat - Plan: amoxicillin (AMOXIL) 500 MG capsule ?Probable strep throat given exposure to strep, sore throat, fever.  Start amoxicillin. Sx care discussed as well as ER precautions. No significant facial or neck swelling appreciated at this time.  COVID testing negative as above.  ? ?Meds ordered this encounter  ?Medications  ? amoxicillin (AMOXIL) 500 MG capsule  ?  Sig: Take 1 capsule (500 mg total) by mouth 2 (two) times daily for 10 days.  ?  Dispense:  20 capsule  ?  Refill:  0  ? ?Patient Instructions  ?Okay to continue Tylenol for sore throat, make sure to drink plenty of fluids.  We will let you know the results of your COVID test later today.  Antibiotic was sent to pharmacy for possible strep throat given the contact with strep throat.  I am glad to hear that you are little bit better.  If any increased neck swelling or any worsening symptoms please be seen here, urgent care or emergency room.  Take care.  ? ?Sore Throat ?A sore throat is pain, burning, irritation, or scratchiness in the throat. When you have a sore throat, you may feel pain or tenderness in your throat when you swallow or talk. ?Many things can cause a sore throat, including: ?An infection. ?Seasonal allergies. ?Dryness in the air. ?Irritants, such as smoke or pollution. ?Radiation  treatment for cancer. ?Gastroesophageal reflux disease (GERD). ?A tumor. ?A sore throat is often the first sign of another sickness. It may happen with other symptoms, such as coughing, sneezing, fever, and swollen neck glands. Most sore throats go away without medical treatment. ?Follow these instructions at home: ? ?  ? ?Medicines ?Take over-the-counter and prescription medicines only as told by your health care provider. ?Children often get sore throats. Do not give your child aspirin because of the association  with Reye's syndrome. ?Use throat sprays to soothe your throat as told by your health care provider. ?Managing pain ?To help with pain, try: ?Sipping warm liquids, such as broth, herbal tea, or warm water. ?Eating or drinking cold or frozen liquids, such as frozen ice pops. ?Gargling with a mixture of salt and water 3-4 times a day or as needed. To make salt water, completely dissolve ?-1 tsp (3-6 g) of salt in 1 cup (237 mL) of warm water. ?Sucking on hard candy or throat lozenges. ?Putting a cool-mist humidifier in your bedroom at night to moisten the air. ?Sitting in the bathroom with the door closed for 5-10 minutes while you run hot water in the shower. ?General instructions ?Do not use any products that contain nicotine or tobacco. These products include cigarettes, chewing tobacco, and vaping devices, such as e-cigarettes. If you need help quitting, ask your health care provider. ?Rest as needed. ?Drink enough fluid to keep your urine pale yellow. ?Wash your hands often with soap and water for at least 20 seconds. If soap and water are not available, use hand sanitizer. ?Contact a health care provider if: ?You have a fever for more than 2-3 days. ?You have symptoms that last for more than 2-3 days. ?Your throat does not get better within 7 days. ?You have a fever and your symptoms suddenly get worse. ?Get help right away if: ?You have difficulty breathing. ?You cannot swallow fluids, soft foods, or  your saliva. ?You have increased swelling in your throat or neck. ?You have persistent nausea and vomiting. ?These symptoms may represent a serious problem that is an emergency. Do not wait to see if the sympt

## 2022-04-27 ENCOUNTER — Ambulatory Visit
Admission: EM | Admit: 2022-04-27 | Discharge: 2022-04-27 | Disposition: A | Discharge status: Home / Self Care | Payer: 59 | Attending: Family Medicine | Admitting: Family Medicine

## 2022-04-27 DIAGNOSIS — L089 Local infection of the skin and subcutaneous tissue, unspecified: Secondary | ICD-10-CM

## 2022-04-27 DIAGNOSIS — L98499 Non-pressure chronic ulcer of skin of other sites with unspecified severity: Secondary | ICD-10-CM

## 2022-04-27 MED ORDER — NAPROXEN 375 MG PO TABS: 375.0000 mg | ORAL_TABLET | Freq: Two times a day (BID) | ORAL | 0 refills | Status: DC

## 2022-04-27 MED ORDER — DOXYCYCLINE HYCLATE 100 MG PO CAPS: 100.0000 mg | ORAL_CAPSULE | Freq: Two times a day (BID) | ORAL | 0 refills | Status: DC

## 2022-04-27 MED ORDER — BACITRACIN ZINC 500 UNIT/GM EX OINT: 1.0000 | TOPICAL_OINTMENT | Freq: Two times a day (BID) | CUTANEOUS | 0 refills | Status: DC | PRN

## 2022-04-27 NOTE — Discharge Instructions (Addendum)
Schedule a follow-up with your primary care provider and request a dermatology referral. Apply medication as prescribed. Complete entire course oral antibiotics.

## 2022-04-27 NOTE — ED Triage Notes (Signed)
Patient c/o multiple lower extremity leg sores that are causing pain with walking.   Started: 2 days ago  Home interventions: none

## 2022-04-27 NOTE — ED Provider Notes (Signed)
[facility]    CSN: [id number] Arrival date & time: 04/27/22  1910      History   Chief Complaint Chief Complaint  Patient presents with  . Sore    HPI Carly Gordon is a 36 y.o. female.   HPI  Past Medical History:  Diagnosis Date  . Anemia   . Postpartum care following cesarean delivery (6/12) 04/09/2014  . Psoriasis    legs    Patient Active Problem List   Diagnosis Date Noted  . Cholelithiasis 06/27/2015  . Hepatic steatosis 06/27/2015  . Postpartum care following cesarean delivery (6/12) 04/09/2014  . Cesarean delivery delivered 04/09/2014    Past Surgical History:  Procedure Laterality Date  . CESAREAN SECTION  02/2012   x 1 [place]  . CESAREAN SECTION N/A 04/09/2014   Procedure: Repeat CESAREAN SECTION;  Surgeon: Robley Fries, MD;  Location: [facility];  Service: Obstetrics;  Laterality: N/A;  EDD: 04/06/14    OB History     Gravida  3   Para  2   Term  2   Preterm      AB  1   Living  2      SAB  1   IAB      Ectopic      Multiple      Live Births  2            Home Medications    Prior to Admission medications   Medication Sig Start Date End Date Taking? Authorizing Provider  clobetasol ointment (TEMOVATE) 0.05 % Apply 1 application topically 2 (two) times daily. 11/06/19   Shade Flood, MD  omeprazole (PRILOSEC) 20 MG capsule Take 1 capsule (20 mg total) by mouth daily. 11/07/19   Shade Flood, MD    Family History Family History  Problem Relation Age of Onset  . Diabetes Mother   . Diabetes Father   . Heart disease Father   . Pancreatic cancer Maternal Grandmother   . Colon cancer Neg Hx   . Stomach cancer Neg Hx   . Esophageal cancer Neg Hx     Social History Social History   Tobacco Use  . Smoking status: Never  . Smokeless tobacco: Never  Substance Use Topics  . Alcohol use: No  . Drug use: No     Allergies   Patient has no known allergies.   Review of Systems Review of  Systems   Physical Exam Triage Vital Signs ED Triage Vitals  Enc Vitals Group     BP 04/27/22 1929 129/82     Pulse Rate 04/27/22 1929 95     Resp 04/27/22 1929 20     Temp 04/27/22 1929 98.3 F (36.8 C)     Temp Source 04/27/22 1929 Oral     SpO2 04/27/22 1929 98 %     Weight --      Height --      Head Circumference --      Peak Flow --      Pain Score 04/27/22 1934 8     Pain Loc --      Pain Edu? --      Excl. in GC? --    No data found.  Updated Vital Signs BP 129/82 (BP Location: Left Arm)   Pulse 95   Temp 98.3 F (36.8 C) (Oral)   Resp 20   LMP 04/07/2022   SpO2 98%   Visual Acuity Right Eye Distance:  Left Eye Distance:   Bilateral Distance:    Right Eye Near:   Left Eye Near:    Bilateral Near:     Physical Exam   UC Treatments / Results  Labs (all labs ordered are listed, but only abnormal results are displayed) Labs Reviewed - No data to display  EKG   Radiology No results found.  Procedures Procedures (including critical care time)  Medications Ordered in UC Medications - No data to display  Initial Impression / Assessment and Plan / UC Course  I have reviewed the triage vital signs and the nursing notes.  Pertinent labs & imaging results that were available during my care of the patient were reviewed by me and considered in my medical decision making (see chart for details).     *** Final Clinical Impressions(s) / UC Diagnoses   Final diagnoses:  None   Discharge Instructions   None    ED Prescriptions   None    PDMP not reviewed this encounter.

## 2022-04-30 ENCOUNTER — Telehealth: Payer: Self-pay | Admitting: Family Medicine

## 2022-04-30 NOTE — Telephone Encounter (Signed)
I understand.  Yes, transfer okay with me.

## 2022-04-30 NOTE — Telephone Encounter (Signed)
Patient called this morning looking to transfer care from Dr. Neva Seat to Dr. Ruthine Dose. States that the location would be an easier commute. Is this okay with you?

## 2022-05-07 ENCOUNTER — Observation Stay (HOSPITAL_COMMUNITY)
Admission: EM | Admit: 2022-05-07 | Discharge: 2022-05-08 | Disposition: A | Discharge status: Home / Self Care | Payer: 59 | Attending: Internal Medicine | Admitting: Internal Medicine

## 2022-05-07 ENCOUNTER — Other Ambulatory Visit: Payer: Self-pay

## 2022-05-07 ENCOUNTER — Encounter (HOSPITAL_COMMUNITY): Payer: Self-pay

## 2022-05-07 DIAGNOSIS — Z79899 Other long term (current) drug therapy: Secondary | ICD-10-CM | POA: Diagnosis not present

## 2022-05-07 DIAGNOSIS — L409 Psoriasis, unspecified: Secondary | ICD-10-CM | POA: Diagnosis not present

## 2022-05-07 DIAGNOSIS — R21 Rash and other nonspecific skin eruption: Secondary | ICD-10-CM | POA: Diagnosis present

## 2022-05-07 DIAGNOSIS — L739 Follicular disorder, unspecified: Secondary | ICD-10-CM | POA: Diagnosis present

## 2022-05-07 DIAGNOSIS — T782XXA Anaphylactic shock, unspecified, initial encounter: Principal | ICD-10-CM | POA: Diagnosis present

## 2022-05-07 DIAGNOSIS — L0291 Cutaneous abscess, unspecified: Secondary | ICD-10-CM

## 2022-05-07 LAB — CBC WITH DIFFERENTIAL/PLATELET
Abs Immature Granulocytes: 0.02 10*3/uL (ref 0.00–0.07)
Basophils Absolute: 0 10*3/uL (ref 0.0–0.1)
Basophils Relative: 0 %
Eosinophils Absolute: 0.2 10*3/uL (ref 0.0–0.5)
Eosinophils Relative: 3 %
HCT: 35.6 % — ABNORMAL LOW (ref 36.0–46.0)
Hemoglobin: 11.2 g/dL — ABNORMAL LOW (ref 12.0–15.0)
Immature Granulocytes: 0 %
Lymphocytes Relative: 38 %
Lymphs Abs: 3.1 10*3/uL (ref 0.7–4.0)
MCH: 24.6 pg — ABNORMAL LOW (ref 26.0–34.0)
MCHC: 31.5 g/dL (ref 30.0–36.0)
MCV: 78.2 fL — ABNORMAL LOW (ref 80.0–100.0)
Monocytes Absolute: 0.4 10*3/uL (ref 0.1–1.0)
Monocytes Relative: 5 %
Neutro Abs: 4.3 10*3/uL (ref 1.7–7.7)
Neutrophils Relative %: 54 %
Platelets: 341 10*3/uL (ref 150–400)
RBC: 4.55 MIL/uL (ref 3.87–5.11)
RDW: 14.1 % (ref 11.5–15.5)
WBC: 8 10*3/uL (ref 4.0–10.5)
nRBC: 0 % (ref 0.0–0.2)

## 2022-05-07 LAB — COMPREHENSIVE METABOLIC PANEL
ALT: 17 U/L (ref 0–44)
AST: 18 U/L (ref 15–41)
Albumin: 3.8 g/dL (ref 3.5–5.0)
Alkaline Phosphatase: 47 U/L (ref 38–126)
Anion gap: 9 (ref 5–15)
BUN: 13 mg/dL (ref 6–20)
CO2: 23 mmol/L (ref 22–32)
Calcium: 9 mg/dL (ref 8.9–10.3)
Chloride: 105 mmol/L (ref 98–111)
Creatinine, Ser: 0.71 mg/dL (ref 0.44–1.00)
GFR, Estimated: >60 mL/min (ref >60)
Glucose, Bld: 106 mg/dL — ABNORMAL HIGH (ref 70–99)
Potassium: 4.2 mmol/L (ref 3.5–5.1)
Sodium: 137 mmol/L (ref 135–145)
Total Bilirubin: 0.3 mg/dL (ref 0.3–1.2)
Total Protein: 6.8 g/dL (ref 6.5–8.1)

## 2022-05-07 LAB — I-STAT BETA HCG BLOOD, ED (MC, WL, AP ONLY): I-stat hCG, quantitative: <5 m[IU]/mL (ref <5)

## 2022-05-07 NOTE — ED Provider Triage Note (Signed)
Emergency Medicine Provider Triage Evaluation Note  Carly Gordon , a 36 y.o. female  was evaluated in triage.  Pt complains of wounds to her bilateral lower extremities.  Patient states that after June 25 she began having wounds showing up on her bilateral lower extremities.  She states that at the beginning of July she went to urgent care because she had a fever and was given doxycycline and bacitracin.  She states that they have continued to "pop up" on her legs, express pus and then resolved.  She states that she is also having soreness in her groin.  She denies any nausea or vomiting.  She denies sores anywhere else.  Of note, she returned from [place] 2 weeks prior to the source starting..  Review of Systems  Positive: See above Negative:  Physical Exam  BP 112/73 (BP Location: Right Arm)   Pulse 78   Temp 98.3 F (36.8 C) (Oral)   Resp 18   Ht 5\' 5"  (1.651 m)   Wt 61 kg   LMP 04/07/2022   SpO2 100%   BMI 22.38 kg/m  Gen:   Awake, no distress   Resp:  Normal effort  MSK:   Moves extremities without difficulty  Other:  DP pulse 2+ bilaterally.  No lower extremity swelling.  There are numerous wounds in various stages of healing on the lower extremities.  Medical Decision Making  Medically screening exam initiated at 10:19 PM.  Appropriate orders placed.  Carly Gordon was informed that the remainder of the evaluation will be completed by another provider, this initial triage assessment does not replace that evaluation, and the importance of remaining in the ED until their evaluation is complete.     Truddie Coco, PA-C 05/07/22 2221

## 2022-05-07 NOTE — ED Triage Notes (Signed)
Pt reports multiple wounds/sores scattered throughout her legs, ongoing for about 2 weeks. She had been taking doxycycline but two more wounds developed to her left leg.

## 2022-05-08 ENCOUNTER — Telehealth (HOSPITAL_COMMUNITY): Payer: Self-pay | Admitting: Pharmacy Technician

## 2022-05-08 ENCOUNTER — Other Ambulatory Visit (HOSPITAL_COMMUNITY): Payer: Self-pay

## 2022-05-08 ENCOUNTER — Encounter (HOSPITAL_COMMUNITY): Payer: Self-pay | Admitting: Internal Medicine

## 2022-05-08 DIAGNOSIS — T782XXA Anaphylactic shock, unspecified, initial encounter: Secondary | ICD-10-CM | POA: Diagnosis not present

## 2022-05-08 DIAGNOSIS — L739 Follicular disorder, unspecified: Secondary | ICD-10-CM | POA: Diagnosis not present

## 2022-05-08 DIAGNOSIS — L409 Psoriasis, unspecified: Secondary | ICD-10-CM | POA: Diagnosis not present

## 2022-05-08 MED ORDER — OXYCODONE HCL 5 MG PO TABS: 5.0000 mg | ORAL_TABLET | ORAL | Status: DC | PRN

## 2022-05-08 MED ORDER — POLYETHYLENE GLYCOL 3350 17 G PO PACK: 17.0000 g | PACK | Freq: Every day | ORAL | Status: DC | PRN

## 2022-05-08 MED ORDER — SULFAMETHOXAZOLE-TRIMETHOPRIM 800-160 MG PO TABS
1.0000 | ORAL_TABLET | Freq: Two times a day (BID) | ORAL | Status: DC
  Administered 2022-05-08: 1 via ORAL
  Filled 2022-05-08: qty 1

## 2022-05-08 MED ORDER — SULFAMETHOXAZOLE-TRIMETHOPRIM 800-160 MG PO TABS: 1.0000 | ORAL_TABLET | Freq: Two times a day (BID) | ORAL | 0 refills | Status: AC

## 2022-05-08 MED ORDER — DIPHENHYDRAMINE HCL 50 MG PO TABS: 50.0000 mg | ORAL_TABLET | Freq: Four times a day (QID) | ORAL | 0 refills | Status: DC | PRN

## 2022-05-08 MED ORDER — CLOBETASOL PROPIONATE 0.05 % EX OINT: 1.0000 | TOPICAL_OINTMENT | Freq: Two times a day (BID) | CUTANEOUS | Status: DC

## 2022-05-08 MED ORDER — FAMOTIDINE IN NACL 20-0.9 MG/50ML-% IV SOLN
20.0000 mg | Freq: Once | INTRAVENOUS | Status: AC
  Administered 2022-05-08: 20 mg via INTRAVENOUS
  Filled 2022-05-08: qty 50

## 2022-05-08 MED ORDER — ACETAMINOPHEN 325 MG PO TABS: 650.0000 mg | ORAL_TABLET | Freq: Four times a day (QID) | ORAL | Status: DC | PRN

## 2022-05-08 MED ORDER — EPINEPHRINE 0.3 MG/0.3ML IJ SOAJ
0.3000 mg | Freq: Once | INTRAMUSCULAR | Status: AC
  Administered 2022-05-08: 0.3 mg via INTRAMUSCULAR
  Filled 2022-05-08: qty 0.3

## 2022-05-08 MED ORDER — DIPHENHYDRAMINE HCL 50 MG/ML IJ SOLN: 50.0000 mg | Freq: Four times a day (QID) | INTRAMUSCULAR | Status: DC | PRN

## 2022-05-08 MED ORDER — ONDANSETRON HCL 4 MG/2ML IJ SOLN: 4.0000 mg | Freq: Four times a day (QID) | INTRAMUSCULAR | Status: DC | PRN

## 2022-05-08 MED ORDER — SODIUM CHLORIDE 0.9% FLUSH
3.0000 mL | Freq: Two times a day (BID) | INTRAVENOUS | Status: DC
  Administered 2022-05-08: 3 mL via INTRAVENOUS

## 2022-05-08 MED ORDER — DIPHENHYDRAMINE HCL 50 MG/ML IJ SOLN
50.0000 mg | Freq: Once | INTRAMUSCULAR | Status: AC
  Administered 2022-05-08: 50 mg via INTRAVENOUS
  Filled 2022-05-08: qty 1

## 2022-05-08 MED ORDER — MORPHINE SULFATE (PF) 2 MG/ML IV SOLN: 2.0000 mg | INTRAVENOUS | Status: DC | PRN

## 2022-05-08 MED ORDER — ENOXAPARIN SODIUM 40 MG/0.4ML IJ SOSY
40.0000 mg | PREFILLED_SYRINGE | INTRAMUSCULAR | Status: DC
  Administered 2022-05-08: 40 mg via SUBCUTANEOUS
  Filled 2022-05-08: qty 0.4

## 2022-05-08 MED ORDER — ACETAMINOPHEN 650 MG RE SUPP: 650.0000 mg | Freq: Four times a day (QID) | RECTAL | Status: DC | PRN

## 2022-05-08 MED ORDER — EPINEPHRINE 0.3 MG/0.3ML IJ SOAJ
0.3000 mg | INTRAMUSCULAR | Status: DC | PRN
Start: 2022-05-08 — End: 2022-05-09

## 2022-05-08 MED ORDER — EPINEPHRINE 0.3 MG/0.3ML IJ SOAJ: 0.3000 mg | INTRAMUSCULAR | 3 refills | Status: DC | PRN

## 2022-05-08 MED ORDER — DOCUSATE SODIUM 100 MG PO CAPS
100.0000 mg | ORAL_CAPSULE | Freq: Two times a day (BID) | ORAL | Status: DC
  Administered 2022-05-08: 100 mg via ORAL
  Filled 2022-05-08: qty 1

## 2022-05-08 MED ORDER — BISACODYL 5 MG PO TBEC: 5.0000 mg | DELAYED_RELEASE_TABLET | Freq: Every day | ORAL | Status: DC | PRN

## 2022-05-08 MED ORDER — HYDRALAZINE HCL 20 MG/ML IJ SOLN: 5.0000 mg | INTRAMUSCULAR | Status: DC | PRN

## 2022-05-08 MED ORDER — ONDANSETRON HCL 4 MG PO TABS: 4.0000 mg | ORAL_TABLET | Freq: Four times a day (QID) | ORAL | Status: DC | PRN

## 2022-05-08 MED ORDER — BACITRACIN ZINC 500 UNIT/GM EX OINT: 1.0000 | TOPICAL_OINTMENT | Freq: Two times a day (BID) | CUTANEOUS | Status: DC | PRN

## 2022-05-08 MED ORDER — DEXTROSE 5 % IV SOLN
1500.0000 mg | Freq: Once | INTRAVENOUS | Status: AC
  Administered 2022-05-08: 1500 mg via INTRAVENOUS
  Filled 2022-05-08: qty 75

## 2022-05-08 MED ORDER — METHYLPREDNISOLONE SODIUM SUCC 125 MG IJ SOLR
125.0000 mg | Freq: Once | INTRAMUSCULAR | Status: AC
Start: 2022-05-08 — End: 2022-05-08
  Administered 2022-05-08: 125 mg via INTRAVENOUS
  Filled 2022-05-08: qty 2

## 2022-05-08 NOTE — H&P (Signed)
History and Physical    Patient: Carly Gordon ZDG:[id number] DOB: Mar 27, 1986 DOA: 05/07/2022 DOS: the patient was seen and examined on 05/08/2022 PCP: Jeani Sow, MD  Patient coming from: Home - lives with husband and children; NOK:  Husband, [phone]   Chief Complaint: Rash  HPI: Carly Gordon is a 36 y.o. female with medical history significant of psoriasis presenting with skin lesions.  UC note from 6/30 diagnosed "infected ulcers" and she was given Doxy, Naproxen, and bacitracin.  She has wounds on her legs.  It started 2 weeks ago, went to UC, gave her generic abx and cream.  They called her PCP and couldn't get in for a month.  The antibiotics completed but the lesions started coming back.  She has been getting a fever on the 3-4th day after onset of lesions, up to 101.  She was given Dalvance and she developed facial rash.  She was given benadryl and she developed SOB and cough.  They gave her an Epipen.  She got nauseated, did not vomit.  She has been sleeping for the last few hours.  No problem swallowing or breathing.    ER Course:  h/o psoriasis, recently treated for folliculitis (?MRSA) with Doxy.  More abscesses, given Dalvance in the ER and had anaphylaxis.  Needs obs for anaphylaxis and possibly ID input about what to treat with.     Review of Systems: As mentioned in the history of present illness. All other systems reviewed and are negative. Past Medical History:  Diagnosis Date   Anemia    Postpartum care following cesarean delivery (6/12) 04/09/2014   Psoriasis    legs   Past Surgical History:  Procedure Laterality Date   CESAREAN SECTION  02/2012   x 1 [place]   CESAREAN SECTION N/A 04/09/2014   Procedure: Repeat CESAREAN SECTION;  Surgeon: Robley Fries, MD;  Location: [facility];  Service: Obstetrics;  Laterality: N/A;  EDD: 04/06/14   Social History:  reports that she has never smoked. She has never used smokeless tobacco. She  reports that she does not drink alcohol and does not use drugs.  Allergies  Allergen Reactions   Dalvance [Dalbavancin] Anaphylaxis    Family History  Problem Relation Age of Onset   Diabetes Mother    Diabetes Father    Heart disease Father    Pancreatic cancer Maternal Grandmother    Colon cancer Neg Hx    Stomach cancer Neg Hx    Esophageal cancer Neg Hx     Prior to Admission medications   Medication Sig Start Date End Date Taking? Authorizing Provider  bacitracin ointment Apply 1 Application topically 2 (two) times daily as needed for wound care. 04/27/22   Bing Neighbors, FNP  clobetasol ointment (TEMOVATE) 0.05 % Apply 1 application topically 2 (two) times daily. 11/06/19   Shade Flood, MD  doxycycline (VIBRAMYCIN) 100 MG capsule Take 1 capsule (100 mg total) by mouth 2 (two) times daily. 04/27/22   Bing Neighbors, FNP  naproxen (NAPROSYN) 375 MG tablet Take 1 tablet (375 mg total) by mouth 2 (two) times daily. 04/27/22   Bing Neighbors, FNP  omeprazole (PRILOSEC) 20 MG capsule Take 1 capsule (20 mg total) by mouth daily. 11/07/19   Shade Flood, MD    Physical Exam: Vitals:   05/08/22 0715 05/08/22 0900 05/08/22 1225 05/08/22 1534  BP: 99/70 94/73 102/65 105/64  Pulse: 78 62 72 93  Resp: (!) 21 16 17  20  Temp:      TempSrc:      SpO2: 100% 100% 100% 100%  Weight:      Height:       General:  Appears calm and comfortable and is in NAD Eyes:  exotropia (chronic), EOMI, normal lids, iris ENT:  grossly normal hearing, lips & tongue, mmm; appropriate dentition Neck:  no LAD, masses or thyromegaly Cardiovascular:  RRR, no m/r/g. No LE edema.  Respiratory:   CTA bilaterally with no wheezes/rales/rhonchi.  Normal respiratory effort. Abdomen:  soft, NT, ND Skin:  healing pustule along L anterior ankle, adjacent to plaque on the lateral ankle; acute pustule on L anterior lower leg with mild surrounding erythema; several other healing pustules along B lower  legs        Musculoskeletal:  grossly normal tone BUE/BLE, good ROM, no bony abnormality Psychiatric:  blunted mood and affect, speech fluent and appropriate, AOx3 Neurologic:  CN 2-12 grossly intact, moves all extremities in coordinated fashion   Radiological Exams on Admission: Independently reviewed - see discussion in A/P where applicable  No results found.  EKG: not done   Labs on Admission: I have personally reviewed the available labs and imaging studies at the time of the admission.  Pertinent labs:    Unremarkable CMP WBC 8 Hgb 11.2 HCG <5 Wound culture pending, gram stain with GPC in pairs   Assessment and Plan: Principal Problem:   Anaphylaxis Active Problems:   Folliculitis   Psoriasis    Anaphylaxis -Patient with apparent anaphylaxis that is most likely associated with dalbavancin infusion -Infusion was stopped and patient was given Pepcid, Benadryl, Solumedrol, and Epipen -Her symptoms have completely resolved but this drug has a very prolonged half-life (terminal half-life is 14.4 days) -Will observe overnight on telemetry -She is likely appropriate for d/c tomorrow, requests to leave first thing in the AM. -Dalbavancin has been added to her allergy list.  Folliculitis -She was recently treated with doxy for this issue -Patient was discussed with ID  -Will treat with Bactrim x 7 days -She will need short-term f/u with rheumatology and dermatology  Psoriasis -Continue clobetasol, bacitracin    Advance Care Planning:   Code Status: Full Code   Consults: ID (telephone only)  DVT Prophylaxis: Lovenox  Family Communication: Husband was present throughout evaluation  Severity of Illness: The appropriate patient status for this patient is OBSERVATION. Observation status is judged to be reasonable and necessary in order to provide the required intensity of service to ensure the patient's safety. The patient's presenting symptoms, physical  exam findings, and initial radiographic and laboratory data in the context of their medical condition is felt to place them at decreased risk for further clinical deterioration. Furthermore, it is anticipated that the patient will be medically stable for discharge from the hospital within 2 midnights of admission.   Author: Jonah Blue, MD 05/08/2022 3:54 PM  For on call review [email or web address].

## 2022-05-08 NOTE — ED Notes (Signed)
Pt AAOx4. Pt in no acute distress. Pt's airway patent. Pt denies difficulty breathing. Pt's respirations equal and unlabored.

## 2022-05-08 NOTE — Progress Notes (Signed)
Pharmacy Note:  Dalbavancin for Acute Bacterial Skin and Skin Structure Infection (ABSSSI) Patients to [facility] Discharge Carly Gordon is an 36 y.o. female who presented to [facility] on 05/07/2022 with an Acute Bacterial Skin and Skin Structure Infection  Inclusion criteria - Indication []  Moderately large skin lesion (>=75 cm2 or larger - about the size of a baseball) [x]  Cellulitis  Inclusion Criteria - at least one SIRS criteria present []  WBC > 12,000 or < 4000 []  temp >100.9 or < 96.8 []  heart rate >90[x]  respiratory rate >20  Patient was evaluated for the following exclusion criteria and no exclusions were found  Hardware involvement, Hypotension / shock, Elevated lactate (>2) without other explanation, ram-negative infection risk factors (bites, water exposure, infection after trauma, infection after skin graft, neutropenia, burns, severe immunocompromise), necrotizing fasciitis possible or confirmed, Known or suspected osteomyelitis or septic arthritis, endocarditis, diabetic foot infection, ischemic ulcers, post-operative wound infection, perirectal infections, need for drainage in the operating room, hand or facial infections, injection drug users with a fever, bacteremia, pregnancy or breastfeeding, allergy to related antibiotics like vancomycin, known liver disease (t.bili >2x ULN or AST/ALT 3x ULN)  , PharmD, BCPS  05/08/2022, 4:30 AM Clinical Pharmacist

## 2022-05-08 NOTE — Discharge Summary (Signed)
Physician Discharge Summary   Patient: Carly Gordon MRN: [id number] DOB: 1986/09/17  Admit date:     05/07/2022  Discharge date: 05/08/22  Discharge Physician: Jonah Blue   PCP: Jeani Sow, MD   Recommendations at discharge:   Take Bactrim for 7 days; follow up with PCP and dermatology Continue clobetasol, bacitracin Have benadryl and Epipen on hand in case allergic symptoms recur Follow up wound culture  Discharge Diagnoses: Principal Problem:   Anaphylaxis Active Problems:   Folliculitis   Psoriasis   Hospital Course: No further issues, patient and husband want to go home and understand warning s/sx to look for.  Assessment and Plan:  Anaphylaxis -Patient with apparent anaphylaxis that is most likely associated with dalbavancin infusion -Infusion was stopped and patient was given Pepcid, Benadryl, Solumedrol, and Epipen -Her symptoms have completely resolved but this drug has a very prolonged half-life (terminal half-life is 14.4 days) -She has been observed for 12+ hours without further symptoms and appears to be appropriate for dc to home -Dalbavancin has been added to her allergy list.   Folliculitis -She was recently treated with doxy for this issue -Patient was discussed with ID  -Will treat with Bactrim x 7 days -She will need short-term f/u with rheumatology and dermatology   Psoriasis -Continue clobetasol, bacitracin   Consultants: ID by telephone only Procedures performed: Wound culture (pending)  Disposition: Home Diet recommendation:  Regular diet DISCHARGE MEDICATION: Allergies as of 05/08/2022       Reactions   Dalvance [dalbavancin] Anaphylaxis        Medication List     STOP taking these medications    doxycycline 100 MG capsule Commonly known as: VIBRAMYCIN   naproxen 375 MG tablet Commonly known as: NAPROSYN   omeprazole 20 MG capsule Commonly known as: PRILOSEC       TAKE these medications     bacitracin ointment Apply 1 Application topically 2 (two) times daily as needed for wound care.   clobetasol ointment 0.05 % Commonly known as: TEMOVATE Apply 1 application topically 2 (two) times daily.   diphenhydrAMINE 50 MG tablet Commonly known as: BENADRYL Take 1 tablet (50 mg total) by mouth every 6 (six) hours as needed for allergies.   EPINEPHrine 0.3 mg/0.3 mL Soaj injection Commonly known as: EPI-PEN Inject 0.3 mg into the muscle as needed (anaphylaxis).   ibuprofen 200 MG tablet Commonly known as: ADVIL Take 200 mg by mouth every 6 (six) hours as needed for headache, mild pain, fever or moderate pain.   sulfamethoxazole-trimethoprim 800-160 MG tablet Commonly known as: BACTRIM DS Take 1 tablet by mouth every 12 (twelve) hours for 7 days. Start taking on: May 09, 2022        Discharge Exam: Ceasar Mons Weights   05/07/22 2209  Weight: 61 kg   Unchanged from admission  Condition at discharge: good  The results of significant diagnostics from this hospitalization (including imaging, microbiology, ancillary and laboratory) are listed below for reference.   Imaging Studies: No results found.  Microbiology: Results for orders placed or performed during the hospital encounter of 05/07/22  Aerobic Culture w Gram Stain (superficial specimen)     Status: None (Preliminary result)   Collection Time: 05/08/22  4:00 AM   Specimen: Wound  Result Value Ref Range Status   Specimen Description WOUND  Final   Special Requests LL  Final   Gram Stain   Final    FEW WBC PRESENT,BOTH PMN AND MONONUCLEAR FEW GRAM POSITIVE COCCI IN  PAIRS Performed at [facility], [address] [place], [place] [address]    Culture PENDING  Incomplete   Report Status PENDING  Incomplete    Labs: CBC: Recent Labs  Lab 05/07/22 2227  WBC 8.0  NEUTROABS 4.3  HGB 11.2*  HCT 35.6*  MCV 78.2*  PLT 341   Basic Metabolic Panel: Recent Labs  Lab 05/07/22 2227  NA 137  K  4.2  CL 105  CO2 23  GLUCOSE 106*  BUN 13  CREATININE 0.71  CALCIUM 9.0   Liver Function Tests: Recent Labs  Lab 05/07/22 2227  AST 18  ALT 17  ALKPHOS 47  BILITOT 0.3  PROT 6.8  ALBUMIN 3.8   CBG: No results for input(s): "GLUCAP" in the last 168 hours.  Discharge time spent: less than 30 minutes.  Signed: Jonah Blue, MD [facility] 05/08/2022

## 2022-05-08 NOTE — Telephone Encounter (Signed)
Pharmacy Patient Advocate Encounter  Insurance verification completed.    The patient is insured through [employer]   The patient is currently admitted and ran test claims for the following: Dalvance.  Copays and coinsurance results were relayed to Inpatient clinical team.

## 2022-05-08 NOTE — Telephone Encounter (Signed)
-----   Message from Juliette Mangle, [facility] sent at 05/08/2022  4:31 AM EDT ----- Regarding: Dalbavancin Pt rec'ing dalbavancin for cellulitis.

## 2022-05-08 NOTE — ED Notes (Signed)
Requested medication from pharmacy x2.

## 2022-05-08 NOTE — ED Notes (Signed)
Pt developed welt under left eye. IV infusion stopped. Pt is AAOx4. Pt's airway patent. Pt denies difficulty breathing. Pt denies any other complaints. Pt in no acute distress. MD notified.

## 2022-05-08 NOTE — ED Notes (Signed)
Pt began coughing. Pt c/o difficulty breathing. Pt's airway patent. Pt c/o nausea. Pt on room air. MD notified.

## 2022-05-08 NOTE — ED Provider Notes (Signed)
[facility] EMERGENCY DEPARTMENT Provider Note   CSN: [id number] Arrival date & time: 05/07/22  2152     History  Chief Complaint  Patient presents with   Wound Check    Carly Gordon is a 36 y.o. female.  HPI     This is a 36 year old female with a known history of psoriasis who presents with lesions to the lower legs.  Patient states that she began to have some sores over the lower legs at the end of June.  She was seen and evaluated in urgent care.  She was discharged with doxycycline and bacitracin ointment.  She seems to have improved and the wound began to heal.  She finished her doxycycline yesterday and since that time has had 2 additional lesions come up on the left leg.  Patient denies shaving her legs.  Denies history of IV drug use.  Denies fevers.  States that mostly she has pain when the sores develop.  Home Medications Prior to Admission medications   Medication Sig Start Date End Date Taking? Authorizing Provider  bacitracin ointment Apply 1 Application topically 2 (two) times daily as needed for wound care. 04/27/22   Bing Neighbors, FNP  clobetasol ointment (TEMOVATE) 0.05 % Apply 1 application topically 2 (two) times daily. 11/06/19   Shade Flood, MD  doxycycline (VIBRAMYCIN) 100 MG capsule Take 1 capsule (100 mg total) by mouth 2 (two) times daily. 04/27/22   Bing Neighbors, FNP  naproxen (NAPROSYN) 375 MG tablet Take 1 tablet (375 mg total) by mouth 2 (two) times daily. 04/27/22   Bing Neighbors, FNP  omeprazole (PRILOSEC) 20 MG capsule Take 1 capsule (20 mg total) by mouth daily. 11/07/19   Shade Flood, MD      Allergies    Dalvance [dalbavancin]    Review of Systems   Review of Systems  Constitutional:  Negative for fever.  Skin:  Positive for wound.  All other systems reviewed and are negative.   Physical Exam Updated Vital Signs BP 110/84   Pulse (!) 102   Temp 97.9 F (36.6 C) (Oral)   Resp 19    Ht 1.651 m (5\' 5" )   Wt 61 kg   LMP 04/07/2022   SpO2 100%   BMI 22.38 kg/m  Physical Exam Vitals and nursing note reviewed.  Constitutional:      Appearance: She is well-developed. She is not ill-appearing.  HENT:     Head: Normocephalic and atraumatic.     Nose: Nose normal.     Mouth/Throat:     Mouth: Mucous membranes are moist.  Eyes:     Pupils: Pupils are equal, round, and reactive to light.  Cardiovascular:     Rate and Rhythm: Normal rate and regular rhythm.  Pulmonary:     Effort: Pulmonary effort is normal. No respiratory distress.  Abdominal:     Palpations: Abdomen is soft.  Musculoskeletal:     Cervical back: Neck supple.  Skin:    General: Skin is warm and dry.     Comments: Indurated large pustule over the left anterior shin, tender to palpation, second ulcerative wound noted over the anterior ankle, healing wounds noted over the bilateral ankles and 1 over the right medial calf  Neurological:     Mental Status: She is alert and oriented to person, place, and time.  Psychiatric:        Mood and Affect: Mood normal.  ED Results / Procedures / Treatments   Labs (all labs ordered are listed, but only abnormal results are displayed) Labs Reviewed  COMPREHENSIVE METABOLIC PANEL - Abnormal; Notable for the following components:      Result Value   Glucose, Bld 106 (*)    All other components within normal limits  CBC WITH DIFFERENTIAL/PLATELET - Abnormal; Notable for the following components:   Hemoglobin 11.2 (*)    HCT 35.6 (*)    MCV 78.2 (*)    MCH 24.6 (*)    All other components within normal limits  AEROBIC CULTURE W GRAM STAIN (SUPERFICIAL SPECIMEN)  I-STAT BETA HCG BLOOD, ED (MC, [facility], [facility] ONLY)    EKG None  Radiology No results found.  Procedures .Critical Care  Performed by: Shon Baton, MD Authorized by: Shon Baton, MD   Critical care provider statement:    Critical care time (minutes):  35   Critical care  was necessary to treat or prevent imminent or life-threatening deterioration of the following conditions: anaphylaxis.   Critical care was time spent personally by me on the following activities:  Development of treatment plan with patient or surrogate, discussions with consultants, evaluation of patient's response to treatment, examination of patient, ordering and review of laboratory studies, ordering and review of radiographic studies, ordering and performing treatments and interventions, pulse oximetry, re-evaluation of patient's condition and review of old charts     Medications Ordered in ED Medications  famotidine (PEPCID) IVPB 20 mg premix (20 mg Intravenous New Bag/Given 05/08/22 0610)  dalbavancin (DALVANCE) 1,500 mg in dextrose 5 % 500 mL IVPB (0 mg Intravenous Stopped 05/08/22 0558)  diphenhydrAMINE (BENADRYL) injection 50 mg (50 mg Intravenous Given 05/08/22 0558)  EPINEPHrine (EPI-PEN) injection 0.3 mg (0.3 mg Intramuscular Given 05/08/22 0610)  methylPREDNISolone sodium succinate (SOLU-MEDROL) 125 mg/2 mL injection 125 mg (125 mg Intravenous Given 05/08/22 0610)    ED Course/ Medical Decision Making/ A&P Clinical Course as of 05/08/22 0630  Tue May 08, 2022  0554 Called by nursing for concern for possible reaction to medication.  Patient has a single urticaria under her left eye.  It is itchy.  She denies shortness of breath, nausea, vomiting.  Nursing stopped infusion.  She had received approximately half.  We will give 1 dose of IV Benadryl and restart infusion and monitor closely.  At this time no signs or symptoms of anaphylaxis. [CH]  0606 Prior to reinitiation of infusion, patient developed coughing and nausea vomiting.  Instructed nursing not to reinitiate med.  No pharyngeal edema noted.  Breath sounds are clear but she has significant coughing and is retching.  Feel this is consistent with anaphylaxis.  She was administered an EpiPen, Solu-Medrol, and Pepcid. [CH]    Clinical  Course User Index [CH] Ethelda Deangelo, Mayer Masker, MD                           Medical Decision Making Risk Prescription drug management. Decision regarding hospitalization.      This patient presents to the ED for concern of recurrent skin infection, this involves an extensive number of treatment options, and is a complaint that carries with it a high risk of complications and morbidity.  I considered the following differential and admission for this acute, potentially life threatening condition.  The differential diagnosis includes MRSA colonization, bacteremia, recurrent abscess  MDM:    This is a 36 year old female who presents with recurrent skin lesions.  Has appropriately been treated with doxycycline but had recurrence of skin lesions that appear to be small abscess and pustules at the end of her course of doxycycline.  She is clinically well-appearing.  She is afebrile.  Labs obtained.  No leukocytosis.  She does have a history of psoriasis but not on any immunosuppressants.  She adamantly denies IV drug use.  Suspect she may be colonized with MRSA.  Given that she is clinically well-appearing, do not feel she necessarily requires hospitalization.  I did culture her wound.  Given concern for systemic spread, patient was given a dose of Dalvance.  Unfortunately, patient developed an allergic reaction to this medication which evolved into anaphylaxis.  She received one half dose or 750 mg.  She was subsequently given Benadryl, epinephrine, Solu-Medrol, and Pepcid.  Her symptoms completely resolved after this administration.  Given the prolonged half-life of Dalvance and the recurrent skin infections after appropriate antibiotics, will discuss with the hospitalist for admission.  (Labs, imaging, consults)  Labs: I Ordered, and personally interpreted labs.  The pertinent results include: CBC, BMP  Imaging Studies ordered: I ordered imaging studies including none I independently visualized and  interpreted imaging. I agree with the radiologist interpretation  Additional history obtained from husband at bedside.  External records from outside source obtained and reviewed including prior evaluations and urgent care notes  Cardiac Monitoring: The patient was maintained on a cardiac monitor.  I personally viewed and interpreted the cardiac monitored which showed an underlying rhythm of: Normal sinus rhythm  Reevaluation: After the interventions noted above, I reevaluated the patient and found that they have :improved  Social Determinants of Health: Lives independently  Disposition: Admit  Co morbidities that complicate the patient evaluation  Past Medical History:  Diagnosis Date   Anemia    Postpartum care following cesarean delivery (6/12) 04/09/2014   Psoriasis    legs     Medicines Meds ordered this encounter  Medications   dalbavancin (DALVANCE) 1,500 mg in dextrose 5 % 500 mL IVPB    Order Specific Question:   Indication:    Answer:   Cellulitis   diphenhydrAMINE (BENADRYL) injection 50 mg   EPINEPHrine (EPI-PEN) injection 0.3 mg   methylPREDNISolone sodium succinate (SOLU-MEDROL) 125 mg/2 mL injection 125 mg    IV methylprednisolone will be converted to either a q12h or q24h frequency with the same total daily dose (TDD).  Ordered Dose: 1 to 125 mg TDD; convert to: TDD q24h.  Ordered Dose: 126 to 250 mg TDD; convert to: TDD div q12h.  Ordered Dose: >250 mg TDD; DAW.   famotidine (PEPCID) IVPB 20 mg premix    I have reviewed the patients home medicines and have made adjustments as needed  Problem List / ED Course: Problem List Items Addressed This Visit   None Visit Diagnoses     Abscess    -  Primary   Anaphylaxis, initial encounter                       Final Clinical Impression(s) / ED Diagnoses Final diagnoses:  Abscess  Anaphylaxis, initial encounter    Rx / DC Orders ED Discharge Orders          Ordered    Ambulatory referral  to Infectious Disease       Comments: Cellulitis patient:  Received dalbavancin on 05/08/2022.   05/08/22 [place], Barbette Hair,  MD 05/08/22 [id number]

## 2022-05-08 NOTE — ED Notes (Signed)
Care assumed at this time. Patient is made aware of the wait before discharge due to her allergic reaction. Patient verbalized understanding. Discharge instructions and medications reviewed with patient. Patient verbalized understanding of discharge instructions. Pending discharge at 2000.

## 2022-05-09 ENCOUNTER — Other Ambulatory Visit: Payer: Self-pay

## 2022-05-09 NOTE — Patient Outreach (Signed)
  Care Coordination [facility] Note Transition Care Management Follow-up Telephone Call Date of discharge and from where: 05/08/22 [facility] How have you been since you were released from the hospital? Doing better sleep good last night Any questions or concerns? No  Items Reviewed: Did the pt receive and understand the discharge instructions provided? Yes  Medications obtained and verified? Yes  Other? No  Any new allergies since your discharge? Yes  Dalbavancin on allergy list Dietary orders reviewed? Yes Do you have support at home? Yes   Home Care and Equipment/Supplies: Were home health services ordered? no If so, what is the name of the agency? N/A  Has the agency set up a time to come to the patient's home? not applicable Were any new equipment or medical supplies ordered?  No What is the name of the medical supply agency? N/A  Were you able to get the supplies/equipment? not applicable Do you have any questions related to the use of the equipment or supplies? No  Functional Questionnaire: (I = Independent and D = Dependent) ADLs: I  Bathing/Dressing- I  Meal Prep- I  Eating- II  Maintaining continence- I  Transferring/Ambulation- I  Managing Meds- I  Follow up appointments reviewed:  PCP Hospital f/u appt confirmed? Yes  Scheduled to see Dr. Ruthine Dose on 05/15/22 @ 2:30PM. Specialist Hospital f/u appt confirmed? Yes  Scheduled to see Dr Thedore Mins Infectious Disease on 05/22/22 @ 10 AM. Are transportation arrangements needed? No  If their condition worsens, is the pt aware to call PCP or go to the Emergency Dept.? Yes Was the patient provided with contact information for the PCP's office or ED? Yes Was to pt encouraged to call back with questions or concerns? Yes  SDOH assessments and interventions completed:   Yes  Care Coordination Interventions Activated:  No Care Coordination Interventions:   No needs assessed  Encounter Outcome:  Pt. Visit Completed  Dudley Major  RN, BSN,CCM, CDE Care Management Coordinator [facility] Care Management [phone]

## 2022-05-10 LAB — AEROBIC CULTURE W GRAM STAIN (SUPERFICIAL SPECIMEN)

## 2022-05-11 NOTE — Progress Notes (Signed)
ED Antimicrobial Stewardship Positive Culture Follow Up   Carly Gordon is an 36 y.o. female who presented to [facility] on 05/07/2022 with a chief complaint of  Chief Complaint  Patient presents with   Wound Check    Recent Results (from the past 720 hour(s))  Aerobic Culture w Gram Stain (superficial specimen)     Status: None   Collection Time: 05/08/22  4:00 AM   Specimen: Wound  Result Value Ref Range Status   Specimen Description WOUND  Final   Special Requests LL  Final   Gram Stain   Final    FEW WBC PRESENT,BOTH PMN AND MONONUCLEAR FEW GRAM POSITIVE COCCI IN PAIRS    Culture   Final    MODERATE GROUP A STREP (S.PYOGENES) ISOLATED Beta hemolytic streptococci are predictably susceptible to penicillin and other beta lactams. Susceptibility testing not routinely performed. Performed at [facility], [address] [place], [place] [address]    Report Status 05/10/2022 FINAL  Final    [x]  Treated with Bactrim, organism resistant to prescribed antimicrobial  New antibiotic prescription: Stop Bactrim. Start cephalexin 500mg  PO four times daily x 7 days.  ED Provider: , MD   [place] 05/11/2022, 9:35 AM Clinical Pharmacist Monday - Friday phone -  [phone] Saturday - Sunday phone - [phone]

## 2022-05-13 ENCOUNTER — Telehealth (HOSPITAL_BASED_OUTPATIENT_CLINIC_OR_DEPARTMENT_OTHER): Payer: Self-pay | Admitting: Emergency Medicine

## 2022-05-13 NOTE — Telephone Encounter (Signed)
Post ED Visit - Positive Culture Follow-up: Unsuccessful Patient Follow-up  Culture assessed and recommendations reviewed by:  []  , Pharm.D. []  Enzo Bi, Pharm.D., BCPS AQ-ID []  , Pharm.D., BCPS []  Celedonio Miyamoto, Pharm.D., BCPS []  [place], Garvin Fila.D., BCPS, AAHIVP []  , Pharm.D., BCPS, AAHIVP [x]  Georgina Pillion, PharmD []  , PharmD, BCPS  Positive aerobic culture  []  Patient discharged without antimicrobial prescription and treatment is now indicated [x]  Organism is resistant to prescribed ED discharge antimicrobial []  Patient with positive blood cultures   Unable to contact patient by phone, letter will be sent to address on file  Plan:Stop Bactrim, Start Cephalexin 500 mg PO four times daily for seven days. [place] MD  [address] 05/13/2022, 2:59 AM

## 2022-05-15 ENCOUNTER — Ambulatory Visit (INDEPENDENT_AMBULATORY_CARE_PROVIDER_SITE_OTHER): Payer: 59 | Admitting: Family Medicine

## 2022-05-15 ENCOUNTER — Encounter: Payer: Self-pay | Admitting: Family Medicine

## 2022-05-15 VITALS — BP 90/70 | HR 86 | Temp 98.8°F | Ht 62.5 in | Wt 144.0 lb

## 2022-05-15 DIAGNOSIS — L409 Psoriasis, unspecified: Secondary | ICD-10-CM | POA: Diagnosis not present

## 2022-05-15 DIAGNOSIS — B950 Streptococcus, group A, as the cause of diseases classified elsewhere: Secondary | ICD-10-CM | POA: Diagnosis not present

## 2022-05-15 MED ORDER — CLOBETASOL PROPIONATE 0.05 % EX OINT: 1.0000 | TOPICAL_OINTMENT | Freq: Two times a day (BID) | CUTANEOUS | 2 refills | Status: DC

## 2022-05-15 NOTE — Patient Instructions (Signed)
It was very nice to see you today!  Will refer to Dermatology   PLEASE NOTE:  If you had any lab tests please let us know if you have not heard back within a few days. You may see your results on MyChart before we have a chance to review them but we will give you a call once they are reviewed by [place]. If we ordered any referrals today, please let us know if you have not heard from their office within the next week.   Please try these tips to maintain a healthy lifestyle:  Eat most of your calories during the day when you are active. Eliminate processed foods including packaged sweets (pies, cakes, cookies), reduce intake of potatoes, white bread, white pasta, and white rice. Look for whole grain options, oat flour or almond flour.  Each meal should contain half fruits/vegetables, one quarter protein, and one quarter carbs (no bigger than a computer mouse).  Cut down on sweet beverages. This includes juice, soda, and sweet tea. Also watch fruit intake, though this is a healthier sweet option, it still contains natural sugar! Limit to 3 servings daily.  Drink at least 1 glass of water with each meal and aim for at least 8 glasses per day  Exercise at least 150 minutes every week.
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
## 2022-05-22 ENCOUNTER — Ambulatory Visit: Payer: 59 | Admitting: Internal Medicine

## 2022-05-22 ENCOUNTER — Other Ambulatory Visit: Payer: Self-pay

## 2022-05-22 ENCOUNTER — Encounter: Payer: Self-pay | Admitting: Internal Medicine

## 2022-05-22 VITALS — BP 107/76 | HR 78 | Temp 98.7°F | Wt 144.0 lb

## 2022-05-22 DIAGNOSIS — L739 Follicular disorder, unspecified: Secondary | ICD-10-CM

## 2022-05-22 NOTE — Progress Notes (Signed)
Patient Active Problem List   Diagnosis Date Noted   Anaphylaxis 16/07/9603   Folliculitis 54/06/8118   Psoriasis 05/08/2022   Cholelithiasis 06/27/2015   Hepatic steatosis 06/27/2015   Postpartum care following cesarean delivery (6/12) 04/09/2014   Cesarean delivery delivered 04/09/2014    Patient's Medications  New Prescriptions   No medications on file  Previous Medications   BACITRACIN OINTMENT    Apply 1 Application topically 2 (two) times daily as needed for wound care.   CLOBETASOL OINTMENT (TEMOVATE) 0.05 %    Apply 1 Application topically 2 (two) times daily.   EPINEPHRINE 0.3 MG/0.3 ML IJ SOAJ INJECTION    Inject 0.3 mg into the muscle as needed (anaphylaxis).  Modified Medications   No medications on file  Discontinued Medications   No medications on file    Subjective: 36 year old female with history of psoriasis presents for management of folliculitis.  She was referred by ED provider.  She was admitted to observation at [facility] from 7/10-7/11 for anaphylaxis following dalbavancin infusion.  She had presented to the ED initially, as she had recently completed a 10-day course of doxycycline  and later developed new LE lesions.  She received doxycycline at urgent care on 6/30 for "infected ulcers".  She received a dose of dalbavancin in the ED and developed facial rash.  She was transitioned to Bactrim x7 days and given rheumatology  and dermatology referral for her psoriasis.  Her wound cultures from the OR strep pyogenous.   Today 11/22/2021 patient's reports her wounds have healed.  She completed the Bactrim.  Noted that initially there was purulence coming from her wound.  She also notes she waxes her legs regularly.  She had waxed about 2-3 days prior to initial folliculitis episode.  She does not have a history prior to doxycycline for folliculitis.  Review of Systems: Review of Systems  All other systems reviewed and are negative.   Past Medical  History:  Diagnosis Date   Anemia    Postpartum care following cesarean delivery (6/12) 04/09/2014   Psoriasis    legs    Social History   Tobacco Use   Smoking status: Never   Smokeless tobacco: Never  Vaping Use   Vaping Use: Never used  Substance Use Topics   Alcohol use: No   Drug use: No    Family History  Problem Relation Age of Onset   Diabetes Mother    Diabetes Father    Heart disease Father 36   Pancreatic cancer Maternal Grandmother    Cancer Paternal Grandmother 8       panc ca   Colon cancer Neg Hx    Stomach cancer Neg Hx    Esophageal cancer Neg Hx     Allergies  Allergen Reactions   Dalvance [Dalbavancin] Anaphylaxis    Health Maintenance  Topic Date Due   PAP SMEAR-Modifier  Never done   Hepatitis C Screening  03/02/2023 (Originally 11/19/2004)   HIV Screening  03/02/2023 (Originally 11/19/2001)   INFLUENZA VACCINE  05/29/2022   TETANUS/TDAP  02/27/2028   HPV VACCINES  Aged Out    Objective:  Vitals:   05/22/22 0959  Weight: 144 lb (65.3 kg)   Body mass index is 25.92 kg/m.  Physical Exam Constitutional:      Appearance: Normal appearance.  HENT:     Head: Normocephalic and atraumatic.     Right Ear: Tympanic membrane normal.     Left  Ear: Tympanic membrane normal.     Nose: Nose normal.     Mouth/Throat:     Mouth: Mucous membranes are moist.  Eyes:     Extraocular Movements: Extraocular movements intact.     Conjunctiva/sclera: Conjunctivae normal.     Pupils: Pupils are equal, round, and reactive to light.  Cardiovascular:     Rate and Rhythm: Normal rate and regular rhythm.     Heart sounds: No murmur heard.    No friction rub. No gallop.  Pulmonary:     Effort: Pulmonary effort is normal.     Breath sounds: Normal breath sounds.  Abdominal:     General: Abdomen is flat.     Palpations: Abdomen is soft.  Musculoskeletal:        General: Normal range of motion.  Skin:    General: Skin is warm and dry.   Neurological:     General: No focal deficit present.     Mental Status: She is alert and oriented to person, place, and time.  Psychiatric:        Mood and Affect: Mood normal.     Lab Results    Lab Results  Component Value Date   CREATININE 0.71 05/07/2022   BUN 13 05/07/2022   NA 137 05/07/2022   K 4.2 05/07/2022   CL 105 05/07/2022   CO2 23 05/07/2022    Lab Results  Component Value Date   ALT 17 05/07/2022   AST 18 05/07/2022   ALKPHOS 47 05/07/2022   BILITOT 0.3 05/07/2022    Lab Results  Component Value Date   CHOL 238 (H) 11/05/2019   HDL 71 11/05/2019   LDLCALC 143 (H) 11/05/2019   TRIG 136 11/05/2019   CHOLHDL 3.4 11/05/2019   Lab Results  Component Value Date   LABRPR NON REAC 04/08/2014   No results found for: "HIV1RNAQUANT", "HIV1RNAVL", "BO1BPZW"   #Folliculitis-resolved #Psoriasis on topicals #Daptomycin Allergy, recorded as Anaphylaxis -She was seen in urgent care on 6/30 and given doxycycline x10 days for" infected wounds".  Patient reports that she had completed her dose of Doxy day before presented to ED on 7/10.  She noted that her old lesion had resolved on Doxy but she did have some new lesions after completion of antibiotics.  Presented to the ED received telavancin, developed facial rash.  She was just on Bactrim x7 days for folliculitis. -Pt reports she waxes her legs every 2 months at a salon.  She also uses Veet hot wax removal kit in the interim.  About 3 days prior to folliculitis (2/58)NIDPOEU patient had used hot wax at home.  I suspect that this is what introduced the bacteria contributing to folliculitis. -Cultures from wound on 7/11 grew strep pyogenous.  Although neither Doxy or Bactrim are not drugs of choice for this particular species bacteria but are active against and it appears to have worked.  Given her underlying diagnosis of psoriasis, I agree with room and Derm follow-up. Plan: - Patient was counseled to clean skin  probably prior to improving. - She has a dermatologist she follows with, does not recall the name.  Advised patient to follow with dermatology.  - Follow-up with ID as needed  I spent more than 67 minutes for this patient encounter including reviewing data/chart, and coordinating care and >50% direct face to face time providing counseling/discussing diagnostics/treatment plan with patient  Laurice Record, [place] [facility] 05/22/2022, 10:01 AM

## 2022-07-23 ENCOUNTER — Encounter: Payer: Self-pay | Admitting: *Deleted

## 2022-10-05 ENCOUNTER — Ambulatory Visit: Payer: 59 | Admitting: Family

## 2022-10-05 ENCOUNTER — Encounter: Payer: Self-pay | Admitting: Family

## 2022-10-05 VITALS — BP 92/65 | HR 88 | Temp 97.7°F | Ht 62.5 in | Wt 144.1 lb

## 2022-10-05 DIAGNOSIS — J029 Acute pharyngitis, unspecified: Secondary | ICD-10-CM | POA: Diagnosis not present

## 2022-10-05 DIAGNOSIS — L409 Psoriasis, unspecified: Secondary | ICD-10-CM | POA: Diagnosis not present

## 2022-10-05 LAB — POC COVID19 BINAXNOW: SARS Coronavirus 2 Ag: NEGATIVE

## 2022-10-05 LAB — POCT RAPID STREP A (OFFICE): Rapid Strep A Screen: NEGATIVE

## 2022-10-05 MED ORDER — CLOBETASOL PROPIONATE 0.05 % EX OINT
1.0000 | TOPICAL_OINTMENT | Freq: Two times a day (BID) | CUTANEOUS | 2 refills | Status: DC
Start: 2022-10-05 — End: 2023-02-13

## 2022-10-05 NOTE — Progress Notes (Signed)
Patient ID: Carly Gordon, female    DOB: 12-Feb-1986, 36 y.o.   MRN: [id number]  Chief Complaint  Patient presents with   Sinusitis    Pt c/o headache, sore throat and chills, present since yesterday. Pt states Husband/daughter has strep.     HPI:      Sore throat:  Pt c/o headache, sore throat and chills, present since yesterday. Pt states Husband & daughter have positive strep. Pt denies fever or cough.  Assessment & Plan:  1. Sore throat rapid testing neg.  Advised pt to take Ibuprofen 600mg  tid prn for sore throat pain, swelling, and fever. Gargle with warm salt water several tid. OK to use OTC Chloraseptic spray and/or throat lozenges prn. Drink plenty of water.   - POCT rapid strep A - POC COVID-19  2. Psoriasis  - clobetasol ointment (TEMOVATE) 0.05 %; Apply 1 Application topically 2 (two) times daily.  Dispense: 30 g; Refill: 2  Subjective:    Outpatient Medications Prior to Visit  Medication Sig Dispense Refill   bacitracin ointment Apply 1 Application topically 2 (two) times daily as needed for wound care. 120 g 0   clobetasol ointment (TEMOVATE) 0.05 % Apply 1 Application topically 2 (two) times daily. 30 g 2   EPINEPHrine 0.3 mg/0.3 mL IJ SOAJ injection Inject 0.3 mg into the muscle as needed (anaphylaxis). 1 each 3   No facility-administered medications prior to visit.   Past Medical History:  Diagnosis Date   Anemia    Postpartum care following cesarean delivery (6/12) 04/09/2014   Psoriasis    legs   Past Surgical History:  Procedure Laterality Date   CESAREAN SECTION  02/27/2012   x 1 [place]   CESAREAN SECTION N/A 04/09/2014   Procedure: Repeat CESAREAN SECTION;  Surgeon: 06/09/2014, MD;  Location: [facility];  Service: Obstetrics;  Laterality: N/A;  EDD: 04/06/14   CHOLECYSTECTOMY  2022   TUBAL LIGATION     Allergies  Allergen Reactions   Dalvance [Dalbavancin] Anaphylaxis      Objective:    Physical Exam Vitals and nursing note  reviewed.  Constitutional:      Appearance: Normal appearance. She is not ill-appearing.     Interventions: Face mask in place.  HENT:     Right Ear: Tympanic membrane and ear canal normal.     Left Ear: Tympanic membrane and ear canal normal.     Nose:     Right Sinus: No frontal sinus tenderness.     Left Sinus: No frontal sinus tenderness.     Mouth/Throat:     Mouth: Mucous membranes are moist.     Pharynx: Posterior oropharyngeal erythema present. No pharyngeal swelling, oropharyngeal exudate or uvula swelling.     Tonsils: No tonsillar exudate or tonsillar abscesses.  Cardiovascular:     Rate and Rhythm: Normal rate and regular rhythm.  Pulmonary:     Effort: Pulmonary effort is normal.     Breath sounds: Normal breath sounds.  Musculoskeletal:        General: Normal range of motion.  Lymphadenopathy:     Head:     Right side of head: No preauricular or posterior auricular adenopathy.     Left side of head: No preauricular or posterior auricular adenopathy.     Cervical: No cervical adenopathy.  Skin:    General: Skin is warm and dry.  Neurological:     Mental Status: She is alert.  Psychiatric:  Mood and Affect: Mood normal.        Behavior: Behavior normal.    BP 92/65 (BP Location: Left Arm, Patient Position: Sitting, Cuff Size: Large)   Pulse 88   Temp 97.7 F (36.5 C) (Temporal)   Ht 5' 2.5" (1.588 m)   Wt 144 lb 2 oz (65.4 kg)   LMP 09/14/2022 (Exact Date)   SpO2 98%   BMI 25.94 kg/m  Wt Readings from Last 3 Encounters:  10/05/22 144 lb 2 oz (65.4 kg)  05/22/22 144 lb (65.3 kg)  05/15/22 144 lb (65.3 kg)       Dulce Sellar, NP

## 2022-10-11 ENCOUNTER — Encounter: Payer: Self-pay | Admitting: *Deleted

## 2023-02-13 ENCOUNTER — Ambulatory Visit (INDEPENDENT_AMBULATORY_CARE_PROVIDER_SITE_OTHER): Payer: BC Managed Care – PPO | Admitting: Family Medicine

## 2023-02-13 ENCOUNTER — Encounter: Payer: Self-pay | Admitting: Family Medicine

## 2023-02-13 VITALS — BP 110/78 | HR 93 | Temp 98.3°F | Ht 62.5 in | Wt 145.2 lb

## 2023-02-13 DIAGNOSIS — J301 Allergic rhinitis due to pollen: Secondary | ICD-10-CM | POA: Diagnosis not present

## 2023-02-13 DIAGNOSIS — Z7184 Encounter for health counseling related to travel: Secondary | ICD-10-CM

## 2023-02-13 DIAGNOSIS — L409 Psoriasis, unspecified: Secondary | ICD-10-CM | POA: Diagnosis not present

## 2023-02-13 MED ORDER — METHYLPREDNISOLONE ACETATE 80 MG/ML IJ SUSP
80.0000 mg | Freq: Once | INTRAMUSCULAR | Status: AC
  Administered 2023-02-13: 80 mg via INTRAMUSCULAR

## 2023-02-13 MED ORDER — ONDANSETRON HCL 4 MG PO TABS: 4.0000 mg | ORAL_TABLET | Freq: Three times a day (TID) | ORAL | 0 refills | Status: DC | PRN

## 2023-02-13 MED ORDER — EPINEPHRINE 0.3 MG/0.3ML IJ SOAJ: 0.3000 mg | INTRAMUSCULAR | 3 refills | Status: AC | PRN

## 2023-02-13 MED ORDER — CLOBETASOL PROPIONATE 0.05 % EX OINT: 1.0000 | TOPICAL_OINTMENT | Freq: Two times a day (BID) | CUTANEOUS | 2 refills | Status: DC

## 2023-02-13 MED ORDER — AZELASTINE HCL 0.1 % NA SOLN: 1.0000 | Freq: Two times a day (BID) | NASAL | 4 refills | Status: DC | PRN

## 2023-02-13 MED ORDER — SCOPOLAMINE 1 MG/3DAYS TD PT72: 1.0000 | MEDICATED_PATCH | TRANSDERMAL | 0 refills | Status: DC

## 2023-02-13 MED ORDER — OLOPATADINE HCL 0.2 % OP SOLN
2.0000 [drp] | Freq: Every day | OPHTHALMIC | 3 refills | Status: DC
Start: 2023-02-13 — End: 2023-02-14

## 2023-02-13 NOTE — Progress Notes (Signed)
Subjective:     Patient ID: Carly Gordon, female    DOB: 1985/11/17, 37 y.o.   MRN: [id number]  Chief Complaint  Patient presents with   Nasal Congestion   Cough    Sx started about 1 month ago, using Zyrtec with no relief   Headache    Headache due to constant sneezing    HPI Congested for 1 month(s). Sneezing, cough.  Happens yearly.  Taking zyrtec twice daily.  Itchy, watery eyes.  No fevers/chills.   Rash on chest intermit-better.  In past, epipen when allergy to something-expired Going on cruise wants patch-has used in past  Health Maintenance Due  Topic Date Due   PAP SMEAR-Modifier  Never done    Past Medical History:  Diagnosis Date   Anemia    Postpartum care following cesarean delivery (6/12) 04/09/2014   Psoriasis    legs    Past Surgical History:  Procedure Laterality Date   CESAREAN SECTION  02/27/2012   x 1 [place]   CESAREAN SECTION N/A 04/09/2014   Procedure: Repeat CESAREAN SECTION;  Surgeon: Robley Fries, MD;  Location: [facility];  Service: Obstetrics;  Laterality: N/A;  EDD: 04/06/14   CHOLECYSTECTOMY  2022   TUBAL LIGATION       Current Outpatient Medications:    azelastine (ASTELIN) 0.1 % nasal spray, Place 1 spray into both nostrils 2 (two) times daily as needed for rhinitis. Use in each nostril as directed, Disp: 30 mL, Rfl: 4   bacitracin ointment, Apply 1 Application topically 2 (two) times daily as needed for wound care., Disp: 120 g, Rfl: 0   Olopatadine HCl 0.2 % SOLN, Apply 2 drops to eye daily in the afternoon., Disp: 2.5 mL, Rfl: 3   ondansetron (ZOFRAN) 4 MG tablet, Take 1 tablet (4 mg total) by mouth every 8 (eight) hours as needed for nausea or vomiting., Disp: 10 tablet, Rfl: 0   scopolamine (TRANSDERM-SCOP) 1 MG/3DAYS, Place 1 patch (1.5 mg total) onto the skin every 3 (three) days., Disp: 4 patch, Rfl: 0   clobetasol ointment (TEMOVATE) 0.05 %, Apply 1 Application topically 2 (two) times daily., Disp: 30 g, Rfl: 2    EPINEPHrine 0.3 mg/0.3 mL IJ SOAJ injection, Inject 0.3 mg into the muscle as needed (anaphylaxis)., Disp: 1 each, Rfl: 3  Allergies  Allergen Reactions   Dalvance [Dalbavancin] Anaphylaxis   ROS neg/noncontributory except as noted HPI/below      Objective:     BP 110/78   Pulse 93   Temp 98.3 F (36.8 C) (Temporal)   Ht 5' 2.5" (1.588 m)   Wt 145 lb 4 oz (65.9 kg)   LMP 01/25/2023 (Exact Date)   SpO2 98%   BMI 26.14 kg/m  Wt Readings from Last 3 Encounters:  02/13/23 145 lb 4 oz (65.9 kg)  10/05/22 144 lb 2 oz (65.4 kg)  05/22/22 144 lb (65.3 kg)    Physical Exam   Gen: WDWN NAD HEENT: NCAT, conjunctiva not injected, sclera nonicteric prosthetic left eye TM WNL B, OP moist, no exudates . Nares-blue, boggy, enlarged, congested  sinuses NT to percussion NECK:  supple, no thyromegaly, no nodes, no carotid bruits CARDIAC: RRR, S1S2+, no murmur.  LUNGS: CTAB. No wheezes EXT:  no edema MSK: no gross abnormalities.  NEURO: A&O x3.  CN II-XII intact.  PSYCH: normal mood. Good eye contact Skin: several psoriatic patches on feet/shins     Assessment & Plan:  Seasonal allergic rhinitis due to pollen Assessment &  Plan: Chronic.  Currently flaring as spring is her season.  Not controlled on Zyrtec 10 mg twice daily.  Gave injection of Depo-Medrol 80 mg in the office.  Add Astelin 2 sprays each nostril twice daily as needed, Pataday optimal logical solution, over-the-counter Flonase 2 sprays each nostril daily.  If not improving, worsening, other symptoms-let us know.  Orders: -     methylPREDNISolone Acetate  Psoriasis Assessment & Plan: Chronic.  Does use moisturizers.  Refilled clobetasol to be used twice daily as needed.  Worsening, not controlling, needs to see dermatology.  Orders: -     Clobetasol Propionate; Apply 1 Application topically 2 (two) times daily.  Dispense: 30 g; Refill: 2  Travel advice encounter  Other orders -     EPINEPHrine; Inject 0.3 mg into  the muscle as needed (anaphylaxis).  Dispense: 1 each; Refill: 3 -     Scopolamine; Place 1 patch (1.5 mg total) onto the skin every 3 (three) days.  Dispense: 4 patch; Refill: 0 -     Ondansetron HCl; Take 1 tablet (4 mg total) by mouth every 8 (eight) hours as needed for nausea or vomiting.  Dispense: 10 tablet; Refill: 0 -     Azelastine HCl; Place 1 spray into both nostrils 2 (two) times daily as needed for rhinitis. Use in each nostril as directed  Dispense: 30 mL; Refill: 4 -     Olopatadine HCl; Apply 2 drops to eye daily in the afternoon.  Dispense: 2.5 mL; Refill: 3  Travel advice-patient going on a cruise.  Filled scopolamine patch.  Advised to wash hands after applying.  Has used in the past.  Also, can use sea bands, ginger.  Stay hydrated.  Prescription also given for on day estrone 4 mg every 8 as needed  Angelena Sole, MD

## 2023-02-13 NOTE — Assessment & Plan Note (Signed)
Chronic.  Currently flaring as spring is her season.  Not controlled on Zyrtec 10 mg twice daily.  Gave injection of Depo-Medrol 80 mg in the office.  Add Astelin 2 sprays each nostril twice daily as needed, Pataday optimal logical solution, over-the-counter Flonase 2 sprays each nostril daily.  If not improving, worsening, other symptoms-let us know.

## 2023-02-13 NOTE — Patient Instructions (Addendum)
Sea bands, ginger, drink a lot of water.   Flonase or nasocort nasal spray daily-for allergy seaso  Pataday/patanol for eyes and the astelin for nose-if needed  zyrtec

## 2023-02-13 NOTE — Assessment & Plan Note (Signed)
Chronic.  Does use moisturizers.  Refilled clobetasol to be used twice daily as needed.  Worsening, not controlling, needs to see dermatology.

## 2023-02-14 ENCOUNTER — Ambulatory Visit: Payer: 59 | Admitting: Family

## 2023-02-14 ENCOUNTER — Other Ambulatory Visit: Payer: Self-pay | Admitting: Family Medicine

## 2023-02-14 MED ORDER — OLOPATADINE HCL 0.2 % OP SOLN: 1.0000 [drp] | Freq: Every day | OPHTHALMIC | 3 refills | Status: DC

## 2023-05-11 ENCOUNTER — Other Ambulatory Visit: Payer: Self-pay | Admitting: Family Medicine

## 2023-09-03 ENCOUNTER — Encounter: Payer: Self-pay | Admitting: Family Medicine

## 2023-09-03 ENCOUNTER — Ambulatory Visit: Payer: BC Managed Care – PPO | Admitting: Family Medicine

## 2023-09-03 VITALS — BP 101/61 | HR 75 | Temp 98.3°F | Ht 62.5 in | Wt 142.6 lb

## 2023-09-03 DIAGNOSIS — D649 Anemia, unspecified: Secondary | ICD-10-CM | POA: Diagnosis not present

## 2023-09-03 DIAGNOSIS — M79605 Pain in left leg: Secondary | ICD-10-CM

## 2023-09-03 DIAGNOSIS — Z0001 Encounter for general adult medical examination with abnormal findings: Secondary | ICD-10-CM

## 2023-09-03 DIAGNOSIS — Z1159 Encounter for screening for other viral diseases: Secondary | ICD-10-CM

## 2023-09-03 DIAGNOSIS — Z0000 Encounter for general adult medical examination without abnormal findings: Secondary | ICD-10-CM

## 2023-09-03 DIAGNOSIS — Z13220 Encounter for screening for lipoid disorders: Secondary | ICD-10-CM

## 2023-09-03 DIAGNOSIS — Z23 Encounter for immunization: Secondary | ICD-10-CM | POA: Diagnosis not present

## 2023-09-03 DIAGNOSIS — L409 Psoriasis, unspecified: Secondary | ICD-10-CM | POA: Diagnosis not present

## 2023-09-03 MED ORDER — CLOBETASOL PROPIONATE 0.05 % EX OINT: 1.0000 | TOPICAL_OINTMENT | Freq: Two times a day (BID) | CUTANEOUS | 2 refills | Status: AC
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
## 2023-09-03 NOTE — Patient Instructions (Addendum)
It was very nice to see you today!  [facility] at [facility]  [address] on the 1st floor Phone number [phone]   Ibuprofen three x/day.  Voltaren gel   PLEASE NOTE:  If you had any lab tests please let us know if you have not heard back within a few days. You may see your results on MyChart before we have a chance to review them but we will give you a call once they are reviewed by [place]. If we ordered any referrals today, please let us know if you have not heard from their office within the next week.   Please try these tips to maintain a healthy lifestyle:  Eat most of your calories during the day when you are active. Eliminate processed foods including packaged sweets (pies, cakes, cookies), reduce intake of potatoes, white bread, white pasta, and white rice. Look for whole grain options, oat flour or almond flour.  Each meal should contain half fruits/vegetables, one quarter protein, and one quarter carbs (no bigger than a computer mouse).  Cut down on sweet beverages. This includes juice, soda, and sweet tea. Also watch fruit intake, though this is a healthier sweet option, it still contains natural sugar! Limit to 3 servings daily.  Drink at least 1 glass of water with each meal and aim for at least 8 glasses per day  Exercise at least 150 minutes every week.

## 2023-09-04 LAB — COMPREHENSIVE METABOLIC PANEL
ALT: 15 U/L (ref 0–35)
AST: 15 U/L (ref 0–37)
Albumin: 4.4 g/dL (ref 3.5–5.2)
Alkaline Phosphatase: 41 U/L (ref 39–117)
BUN: 7 mg/dL (ref 6–23)
CO2: 24 meq/L (ref 19–32)
Calcium: 9.2 mg/dL (ref 8.4–10.5)
Chloride: 105 meq/L (ref 96–112)
Creatinine, Ser: 0.63 mg/dL (ref 0.40–1.20)
GFR: 113.83 mL/min (ref >60.00)
Glucose, Bld: 77 mg/dL (ref 70–99)
Potassium: 3.9 meq/L (ref 3.5–5.1)
Sodium: 138 meq/L (ref 135–145)
Total Bilirubin: 0.5 mg/dL (ref 0.2–1.2)
Total Protein: 6.9 g/dL (ref 6.0–8.3)

## 2023-09-04 LAB — CBC WITH DIFFERENTIAL/PLATELET
Basophils Absolute: 0 10*3/uL (ref 0.0–0.1)
Basophils Relative: 0.6 % (ref 0.0–3.0)
Eosinophils Absolute: 0.1 10*3/uL (ref 0.0–0.7)
Eosinophils Relative: 1.3 % (ref 0.0–5.0)
HCT: 37.8 % (ref 36.0–46.0)
Hemoglobin: 12 g/dL (ref 12.0–15.0)
Lymphocytes Relative: 31.5 % (ref 12.0–46.0)
Lymphs Abs: 2 10*3/uL (ref 0.7–4.0)
MCHC: 31.9 g/dL (ref 30.0–36.0)
MCV: 78.3 fL (ref 78.0–100.0)
Monocytes Absolute: 0.3 10*3/uL (ref 0.1–1.0)
Monocytes Relative: 5.2 % (ref 3.0–12.0)
Neutro Abs: 3.9 10*3/uL (ref 1.4–7.7)
Neutrophils Relative %: 61.4 % (ref 43.0–77.0)
Platelets: 315 10*3/uL (ref 150.0–400.0)
RBC: 4.83 Mil/uL (ref 3.87–5.11)
RDW: 14.7 % (ref 11.5–15.5)
WBC: 6.3 10*3/uL (ref 4.0–10.5)

## 2023-09-04 LAB — HEPATITIS C ANTIBODY: Hepatitis C Ab: NONREACTIVE

## 2023-09-04 LAB — LIPID PANEL
Cholesterol: 202 mg/dL — ABNORMAL HIGH (ref 0–200)
HDL: 61.7 mg/dL (ref >39.00)
LDL Cholesterol: 122 mg/dL — ABNORMAL HIGH (ref 0–99)
NonHDL: 140.69
Total CHOL/HDL Ratio: 3
Triglycerides: 92 mg/dL (ref 0.0–149.0)
VLDL: 18.4 mg/dL (ref 0.0–40.0)

## 2023-09-04 LAB — IBC + FERRITIN
Ferritin: 4.5 ng/mL — ABNORMAL LOW (ref 10.0–291.0)
Iron: 72 ug/dL (ref 42–145)
Saturation Ratios: 18.9 % — ABNORMAL LOW (ref 20.0–50.0)
TIBC: 380.8 ug/dL (ref 250.0–450.0)
Transferrin: 272 mg/dL (ref 212.0–360.0)

## 2023-09-04 LAB — VITAMIN B12: Vitamin B-12: 141 pg/mL — ABNORMAL LOW (ref 211–911)

## 2023-09-04 LAB — TSH: TSH: 1.46 u[IU]/mL (ref 0.35–5.50)

## 2023-09-04 LAB — HIV ANTIBODY (ROUTINE TESTING W REFLEX): HIV 1&2 Ab, 4th Generation: NONREACTIVE

## 2023-09-04 LAB — VITAMIN D 25 HYDROXY (VIT D DEFICIENCY, FRACTURES): VITD: 12.52 ng/mL — ABNORMAL LOW (ref 30.00–100.00)

## 2023-09-04 LAB — HEMOGLOBIN A1C: Hgb A1c MFr Bld: 5.9 % (ref 4.6–6.5)

## 2023-09-04 NOTE — Progress Notes (Signed)
Labs good except 1.  A1C(3 month average of sugars) is elevated.  This is considered PreDiabetes.  Work on diet-decrease sugars and starches and aim for 30 minutes of exercise 5 days/week to prevent progression to diabetes  2.  B12, D and iron all low-needs to take vitamin D 5000 iu/day, b12 2000 mcg/day and iron 325 mg(or 64 mg elemental iron) every other day    3  sch appt in 3 months to see me to repeat labs

## 2023-09-05 ENCOUNTER — Encounter: Payer: Self-pay | Admitting: Family Medicine

## 2023-10-08 ENCOUNTER — Encounter: Payer: Self-pay | Admitting: Family Medicine

## 2023-12-10 ENCOUNTER — Ambulatory Visit: Payer: 59 | Admitting: Family Medicine

## 2023-12-10 ENCOUNTER — Encounter: Payer: Self-pay | Admitting: Family Medicine

## 2023-12-10 VITALS — BP 114/80 | HR 94 | Temp 99.5°F | Resp 18 | Ht 62.5 in | Wt 136.5 lb

## 2023-12-10 DIAGNOSIS — Z20828 Contact with and (suspected) exposure to other viral communicable diseases: Secondary | ICD-10-CM | POA: Diagnosis not present

## 2023-12-10 DIAGNOSIS — R509 Fever, unspecified: Secondary | ICD-10-CM | POA: Diagnosis not present

## 2023-12-10 DIAGNOSIS — R52 Pain, unspecified: Secondary | ICD-10-CM

## 2023-12-10 DIAGNOSIS — J101 Influenza due to other identified influenza virus with other respiratory manifestations: Secondary | ICD-10-CM | POA: Diagnosis not present

## 2023-12-10 DIAGNOSIS — R519 Headache, unspecified: Secondary | ICD-10-CM

## 2023-12-10 DIAGNOSIS — R059 Cough, unspecified: Secondary | ICD-10-CM

## 2023-12-10 LAB — POCT INFLUENZA A/B
Influenza A, POC: POSITIVE — AB
Influenza B, POC: NEGATIVE

## 2023-12-10 LAB — POC COVID19 BINAXNOW: SARS Coronavirus 2 Ag: NEGATIVE

## 2023-12-10 MED ORDER — OSELTAMIVIR PHOSPHATE 75 MG PO CAPS: 75.0000 mg | ORAL_CAPSULE | Freq: Two times a day (BID) | ORAL | 0 refills | Status: DC
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
## 2023-12-10 NOTE — Patient Instructions (Signed)
Motrin 600-800mg  every 6 hrs as needed Tylenol 500-1000mg  3x/day.

## 2024-01-30 ENCOUNTER — Telehealth: Payer: Self-pay | Admitting: *Deleted

## 2024-01-30 NOTE — Telephone Encounter (Signed)
 Copied from CRM [id number]. Topic: Clinical - Request for Lab/Test Order >> Jan 30, 2024  2:46 PM Almira Coaster wrote: Reason for CRM: Patient is calling to schedule an appointment; however, no active labs on file. Patient states Dr.Kulik wanted to check Ac1, vitamin D,Iron and CPE labs. Please advise if we can schedule.  Per last result note, patient is to schedule 3 follow-up with pcp and have labs done at visit.

## 2024-02-06 ENCOUNTER — Ambulatory Visit: Admitting: Family Medicine

## 2024-02-06 ENCOUNTER — Encounter: Payer: Self-pay | Admitting: Family Medicine

## 2024-02-06 VITALS — BP 99/63 | HR 68 | Temp 97.9°F | Resp 16 | Ht 62.5 in | Wt 134.2 lb

## 2024-02-06 DIAGNOSIS — E559 Vitamin D deficiency, unspecified: Secondary | ICD-10-CM | POA: Insufficient documentation

## 2024-02-06 DIAGNOSIS — D649 Anemia, unspecified: Secondary | ICD-10-CM | POA: Diagnosis not present

## 2024-02-06 DIAGNOSIS — R7303 Prediabetes: Secondary | ICD-10-CM | POA: Insufficient documentation

## 2024-02-06 DIAGNOSIS — E538 Deficiency of other specified B group vitamins: Secondary | ICD-10-CM | POA: Insufficient documentation

## 2024-02-06 LAB — LIPID PANEL
Cholesterol: 198 mg/dL (ref 0–200)
HDL: 65.3 mg/dL (ref >39.00)
LDL Cholesterol: 110 mg/dL — ABNORMAL HIGH (ref 0–99)
NonHDL: 132.8
Total CHOL/HDL Ratio: 3
Triglycerides: 115 mg/dL (ref 0.0–149.0)
VLDL: 23 mg/dL (ref 0.0–40.0)

## 2024-02-06 LAB — CBC WITH DIFFERENTIAL/PLATELET
Basophils Absolute: 0 10*3/uL (ref 0.0–0.1)
Basophils Relative: 0.7 % (ref 0.0–3.0)
Eosinophils Absolute: 0.4 10*3/uL (ref 0.0–0.7)
Eosinophils Relative: 6 % — ABNORMAL HIGH (ref 0.0–5.0)
HCT: 41.2 % (ref 36.0–46.0)
Hemoglobin: 13.6 g/dL (ref 12.0–15.0)
Lymphocytes Relative: 33.6 % (ref 12.0–46.0)
Lymphs Abs: 2.1 10*3/uL (ref 0.7–4.0)
MCHC: 33 g/dL (ref 30.0–36.0)
MCV: 80.7 fl (ref 78.0–100.0)
Monocytes Absolute: 0.3 10*3/uL (ref 0.1–1.0)
Monocytes Relative: 5.3 % (ref 3.0–12.0)
Neutro Abs: 3.4 10*3/uL (ref 1.4–7.7)
Neutrophils Relative %: 54.4 % (ref 43.0–77.0)
Platelets: 299 10*3/uL (ref 150.0–400.0)
RBC: 5.11 Mil/uL (ref 3.87–5.11)
RDW: 14.1 % (ref 11.5–15.5)
WBC: 6.3 10*3/uL (ref 4.0–10.5)

## 2024-02-06 LAB — COMPREHENSIVE METABOLIC PANEL WITH GFR
ALT: 23 U/L (ref 0–35)
AST: 22 U/L (ref 0–37)
Albumin: 4.7 g/dL (ref 3.5–5.2)
Alkaline Phosphatase: 42 U/L (ref 39–117)
BUN: 9 mg/dL (ref 6–23)
CO2: 25 meq/L (ref 19–32)
Calcium: 9.4 mg/dL (ref 8.4–10.5)
Chloride: 107 meq/L (ref 96–112)
Creatinine, Ser: 0.59 mg/dL (ref 0.40–1.20)
GFR: 115.3 mL/min (ref >60.00)
Glucose, Bld: 89 mg/dL (ref 70–99)
Potassium: 4.2 meq/L (ref 3.5–5.1)
Sodium: 139 meq/L (ref 135–145)
Total Bilirubin: 0.4 mg/dL (ref 0.2–1.2)
Total Protein: 7.2 g/dL (ref 6.0–8.3)

## 2024-02-06 LAB — IBC + FERRITIN
Ferritin: 12.5 ng/mL (ref 10.0–291.0)
Iron: 74 ug/dL (ref 42–145)
Saturation Ratios: 20.1 % (ref 20.0–50.0)
TIBC: 368.2 ug/dL (ref 250.0–450.0)
Transferrin: 263 mg/dL (ref 212.0–360.0)

## 2024-02-06 LAB — HEMOGLOBIN A1C: Hgb A1c MFr Bld: 5.7 % (ref 4.6–6.5)

## 2024-02-06 LAB — VITAMIN D 25 HYDROXY (VIT D DEFICIENCY, FRACTURES): VITD: 26.79 ng/mL — ABNORMAL LOW (ref 30.00–100.00)

## 2024-02-06 LAB — VITAMIN B12: Vitamin B-12: 407 pg/mL (ref 211–911)
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
## 2024-02-06 NOTE — Progress Notes (Signed)
 Labs are so much better!!! Keep up the great work!!! Vtiamin D-can do every other day Same dose of B12 and iron. Eosinophils-allergies

## 2024-02-06 NOTE — Patient Instructions (Signed)
 It was very nice to see you today!  Keep up the great work!   PLEASE NOTE:  If you had any lab tests please let us know if you have not heard back within a few days. You may see your results on MyChart before we have a chance to review them but we will give you a call once they are reviewed by [place]. If we ordered any referrals today, please let us know if you have not heard from their office within the next week.   Please try these tips to maintain a healthy lifestyle:  Eat most of your calories during the day when you are active. Eliminate processed foods including packaged sweets (pies, cakes, cookies), reduce intake of potatoes, white bread, white pasta, and white rice. Look for whole grain options, oat flour or almond flour.  Each meal should contain half fruits/vegetables, one quarter protein, and one quarter carbs (no bigger than a computer mouse).  Cut down on sweet beverages. This includes juice, soda, and sweet tea. Also watch fruit intake, though this is a healthier sweet option, it still contains natural sugar! Limit to 3 servings daily.  Drink at least 1 glass of water with each meal and aim for at least 8 glasses per day  Exercise at least 150 minutes every week.

## 2024-04-21 ENCOUNTER — Encounter: Payer: Self-pay | Admitting: Physician Assistant

## 2024-04-21 ENCOUNTER — Ambulatory Visit: Admitting: Physician Assistant

## 2024-04-21 VITALS — BP 100/60 | HR 90 | Temp 96.8°F | Ht 62.5 in | Wt 137.4 lb

## 2024-04-21 DIAGNOSIS — L03317 Cellulitis of buttock: Secondary | ICD-10-CM | POA: Diagnosis not present

## 2024-04-21 DIAGNOSIS — L0231 Cutaneous abscess of buttock: Secondary | ICD-10-CM | POA: Diagnosis not present

## 2024-04-21 MED ORDER — SULFAMETHOXAZOLE-TRIMETHOPRIM 800-160 MG PO TABS: 2.0000 | ORAL_TABLET | Freq: Two times a day (BID) | ORAL | 0 refills | Status: AC
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
## 2024-04-21 NOTE — Patient Instructions (Signed)
  VISIT SUMMARY: Today, you were seen for a red, painful bump on your right buttock that has been present for the last two to three days. The doctor diagnosed you with cellulitis and started you on treatment.  YOUR PLAN: CELLULITIS OF THE RIGHT BUTTOCK: You have a red, painful bump on your right buttock, which is a skin infection called cellulitis. -Take Bactrim  double strength, two tablets orally twice daily for five days. -Apply warm compresses and take sitz baths to help with the discomfort. -Follow up with your primary care provider in two days to check if the area needs to be drained. -Seek emergency care if you develop fever, chills, or if the pain worsens.                      Contains text generated by [employer].                                 Contains text generated by [employer]bridge.

## 2024-04-23 ENCOUNTER — Encounter: Payer: Self-pay | Admitting: Family Medicine

## 2024-04-23 ENCOUNTER — Ambulatory Visit: Admitting: Family Medicine

## 2024-04-23 VITALS — BP 100/60 | HR 91 | Temp 98.2°F | Ht 62.5 in | Wt 139.0 lb

## 2024-04-23 DIAGNOSIS — L03317 Cellulitis of buttock: Secondary | ICD-10-CM | POA: Diagnosis not present

## 2024-04-23 DIAGNOSIS — L0231 Cutaneous abscess of buttock: Secondary | ICD-10-CM

## 2024-04-23 MED ORDER — DOXYCYCLINE HYCLATE 100 MG PO TABS: 100.0000 mg | ORAL_TABLET | Freq: Two times a day (BID) | ORAL | 0 refills | Status: DC

## 2024-04-23 NOTE — Patient Instructions (Addendum)
 It was very nice to see you today!  Motrin  800mg  every 8 hrs or 600mg  ever 6 hrs.  Tylenol  as well.   ER if worse  Sent doxyclcline to [facility] Continue warm soaks   PLEASE NOTE:  If you had any lab tests please let us  know if you have not heard back within a few days. You may see your results on MyChart before we have a chance to review them but we will give you a call once they are reviewed by us . If we ordered any referrals today, please let us  know if you have not heard from their office within the next week.   Please try these tips to maintain a healthy lifestyle:  Eat most of your calories during the day when you are active. Eliminate processed foods including packaged sweets (pies, cakes, cookies), reduce intake of potatoes, white bread, white pasta, and white rice. Look for whole grain options, oat flour or almond flour.  Each meal should contain half fruits/vegetables, one quarter protein, and one quarter carbs (no bigger than a computer mouse).  Cut down on sweet beverages. This includes juice, soda, and sweet tea. Also watch fruit intake, though this is a healthier sweet option, it still contains natural sugar! Limit to 3 servings daily.  Drink at least 1 glass of water with each meal and aim for at least 8 glasses per day  Exercise at least 150 minutes every week.
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
## 2024-04-24 ENCOUNTER — Encounter: Payer: Self-pay | Admitting: Family Medicine

## 2024-04-27 ENCOUNTER — Ambulatory Visit: Admitting: Family Medicine

## 2024-04-27 ENCOUNTER — Telehealth: Payer: Self-pay | Admitting: Family Medicine

## 2024-04-27 ENCOUNTER — Encounter: Payer: Self-pay | Admitting: Family Medicine

## 2024-04-27 VITALS — BP 117/77 | HR 74 | Temp 97.9°F | Resp 16 | Ht 62.5 in | Wt 138.0 lb

## 2024-04-27 DIAGNOSIS — L0231 Cutaneous abscess of buttock: Secondary | ICD-10-CM | POA: Diagnosis not present

## 2024-04-27 DIAGNOSIS — L03317 Cellulitis of buttock: Secondary | ICD-10-CM

## 2024-04-27 MED ORDER — DOXYCYCLINE HYCLATE 100 MG PO TABS: 100.0000 mg | ORAL_TABLET | Freq: Two times a day (BID) | ORAL | 0 refills | Status: AC

## 2024-04-27 NOTE — Telephone Encounter (Signed)
 Pt advised to go to ED. Pt did not go but has an appt today in this office.    Patient Name First: Carly FRENCH Last: RAMAKRISH Gordon Gender: Female DOB: [birth year] Age: 38 Y 5 M 6 D Return Phone Number: [phone] (Primary), [phone] (Secondary) Address: City/ State/ Zip: [place]  [address] Client [facility] Human resources [facility] [employer] Provider Wendolyn, Jenkins Contact Type Call Who Is Calling Patient / Member / Family / Caregiver Call Type Triage / Clinical Caller Name Boopathi Caller Phone Number [phone] Relationship To Patient Spouse Return Phone Number [phone] (Primary) Chief Complaint Leg Pain Reason for Call Symptomatic / Request for Health Information Initial Comment Caller is calling for his wife that has a leg infection, has severe leg pain. Translation No Nurse Assessment Nurse: Marylen, RN, Ashante Date/Time (Eastern Time): 04/25/2024 5:29:31 PM Confirm and document reason for call. If symptomatic, describe symptoms. ---The caller states that his wife is having right leg pain and an infection. Does the patient have any new or worsening symptoms? ---Yes Will a triage be completed? ---Yes Related visit to physician within the last 2 weeks? ---Yes Does the PT have any chronic conditions? (i.e. diabetes, asthma, this includes High risk factors for pregnancy, etc.) ---No Is the patient pregnant or possibly pregnant? (Ask all females between the ages of 47-55) ---No Is this a behavioral health or substance abuse call? ---No Guidelines Guideline Title Affirmed Question Affirmed Notes Nurse Date/Time (Eastern Time) Wound Infection on Antibiotic Follow-up Call SEVERE pain in the wound [place], RN, Ashante 04/25/2024 5:31:47 PM  Disp. Time [place] Time) Disposition Final User 04/25/2024 5:35:16 PM Go to ED Now Yes Marylen, RN, Ashante Final Disposition 04/25/2024  5:35:16 PM Go to ED Now Yes Marylen, RN, Ashante Caller Disagree/Comply Disagree Caller Understands Yes PreDisposition Call Doctor Care Advice Given Per Guideline GO TO ED NOW: * You need to be seen in the Emergency Department. * Go to the ED at ___________ Hospital. * Leave now. Drive carefully. BRING MEDICINES: * Bring a list of your current medicines when you go to the Emergency Department (ER). * Bring the pill bottles too. This will help the doctor (or NP/PA) to make certain you are taking the right medicines and the right dose. CARE ADVICE per Wound Infection on Antibiotic Follow-up Call (Adult) guideline. Referrals GO TO FACILITY UNDECIDED

## 2024-04-27 NOTE — Telephone Encounter (Signed)
 Patient stated she is much better today, not as severe, but she will be here at 11:30 today.

## 2024-04-27 NOTE — Patient Instructions (Signed)
 Await labs  Keep skin clean as you are  Continue warm soaks  New antibiotic sent to [facility] [address]
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
## 2024-04-28 ENCOUNTER — Ambulatory Visit: Payer: Self-pay | Admitting: Family Medicine

## 2024-04-28 LAB — IGG, IGA, IGM
IgG (Immunoglobin G), Serum: 1076 mg/dL (ref 600–1640)
IgM, Serum: 145 mg/dL (ref 50–300)
Immunoglobulin A: 200 mg/dL (ref 47–310)

## 2024-04-28 NOTE — Progress Notes (Signed)
 Immunoglobulins fine.   Does she want to see infectious disease doctor again?  If so do referral.  Otherwise, will monitor if recurs

## 2024-06-10 ENCOUNTER — Ambulatory Visit: Admitting: Family Medicine

## 2024-06-10 ENCOUNTER — Encounter: Payer: Self-pay | Admitting: Family Medicine

## 2024-06-10 VITALS — BP 120/76 | HR 95 | Temp 98.7°F | Ht 62.5 in | Wt 145.6 lb

## 2024-06-10 DIAGNOSIS — L739 Follicular disorder, unspecified: Secondary | ICD-10-CM | POA: Diagnosis not present
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
## 2024-09-09 ENCOUNTER — Encounter: Admitting: Family Medicine

## 2024-09-28 ENCOUNTER — Encounter: Payer: Self-pay | Admitting: Family Medicine

## 2024-10-02 ENCOUNTER — Encounter: Payer: Self-pay | Admitting: Family

## 2024-10-02 ENCOUNTER — Ambulatory Visit: Admitting: Family

## 2024-10-02 VITALS — BP 96/64 | HR 93 | Temp 99.5°F | Resp 16 | Ht 63.0 in | Wt 146.6 lb

## 2024-10-02 DIAGNOSIS — N644 Mastodynia: Secondary | ICD-10-CM | POA: Insufficient documentation

## 2024-10-02 DIAGNOSIS — Z970 Presence of artificial eye: Secondary | ICD-10-CM | POA: Diagnosis not present

## 2024-10-02 DIAGNOSIS — H5440 Blindness, one eye, unspecified eye: Secondary | ICD-10-CM | POA: Insufficient documentation

## 2024-10-02 DIAGNOSIS — R590 Localized enlarged lymph nodes: Secondary | ICD-10-CM | POA: Insufficient documentation

## 2024-10-02 DIAGNOSIS — R102 Pelvic and perineal pain unspecified side: Secondary | ICD-10-CM | POA: Insufficient documentation

## 2024-10-02 DIAGNOSIS — R5383 Other fatigue: Secondary | ICD-10-CM | POA: Insufficient documentation

## 2024-10-02 DIAGNOSIS — N911 Secondary amenorrhea: Secondary | ICD-10-CM | POA: Insufficient documentation

## 2024-10-02 DIAGNOSIS — Z9851 Tubal ligation status: Secondary | ICD-10-CM | POA: Insufficient documentation

## 2024-10-02 NOTE — Telephone Encounter (Signed)
 Patient came in for an appointment today and successfully got papers completed.
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
## 2024-10-16 ENCOUNTER — Ambulatory Visit: Payer: Self-pay

## 2024-10-16 NOTE — Telephone Encounter (Signed)
 FYI Only or Action Required?: Action required by provider: request for appointment.  Patient was last seen in primary care on 10/02/2024 by Lucius Krabbe, NP.  Called Nurse Triage reporting Sore Throat.  Symptoms began yesterday.  Interventions attempted: Nothing.  Symptoms are: unchanged.Offered appointment today, unable to come due to a meeting. Will call back.   Triage Disposition: See PCP When Office is Open (Within 3 Days)  Patient/caregiver understands and will follow disposition?: Yes      Copied from CRM #[id number]. Topic: Clinical - Red Word Triage >> Oct 16, 2024  9:00 AM Harlene ORN wrote: Red Word that prompted transfer to Nurse Triage: throat pain and low grade fever since yesterday (98-99 degrees) . would like to be seen today by pcp if possible. Reason for Disposition  [1] Sore throat with cough/cold symptoms AND [2] present > 5 days  Answer Assessment - Initial Assessment Questions 1. ONSET: When did the throat start hurting? (Hours or days ago)      yesterday 2. SEVERITY: How bad is the sore throat? (Scale 1-10; mild, moderate or severe)     moderate 3. STREP EXPOSURE: Has there been any exposure to strep within the past week? If Yes, ask: What type of contact occurred?      no 4.  VIRAL SYMPTOMS: Are there any symptoms of a cold, such as a runny nose, cough, hoarse voice or red eyes?      Runny nose, cough 5. FEVER: Do you have a fever? If Yes, ask: What is your temperature, how was it measured, and when did it start?     99 6. PUS ON THE TONSILS: Is there pus on the tonsils in the back of your throat?     N/a 7. OTHER SYMPTOMS: Do you have any other symptoms? (e.g., difficulty breathing, headache, rash)     no 8. PREGNANCY: Is there any chance you are pregnant? When was your last menstrual period?     no  Protocols used: Sore Throat-A-AH

## 2024-10-16 NOTE — Telephone Encounter (Signed)
 Noted pt declined appt for today and will call back to schedule an appt if needed

## 2024-10-23 ENCOUNTER — Ambulatory Visit: Admitting: Family Medicine

## 2024-12-03 ENCOUNTER — Encounter: Admitting: Family Medicine
